# Patient Record
Sex: Female | Born: 1975 | Race: White | Hispanic: No | Marital: Married | Smoking: Never smoker
Health system: Southern US, Community
[De-identification: ages and names within clinical notes are randomized; demographics above are authoritative.]

## PROBLEM LIST (undated history)

## (undated) DIAGNOSIS — N939 Abnormal uterine and vaginal bleeding, unspecified: Secondary | ICD-10-CM

## (undated) DIAGNOSIS — O2230 Deep phlebothrombosis in pregnancy, unspecified trimester: Secondary | ICD-10-CM

## (undated) DIAGNOSIS — D6859 Other primary thrombophilia: Secondary | ICD-10-CM

## (undated) DIAGNOSIS — D500 Iron deficiency anemia secondary to blood loss (chronic): Secondary | ICD-10-CM

## (undated) DIAGNOSIS — D259 Leiomyoma of uterus, unspecified: Secondary | ICD-10-CM

## (undated) HISTORY — DX: Deep phlebothrombosis in pregnancy, unspecified trimester: O22.30

## (undated) HISTORY — DX: Other primary thrombophilia: D68.59

---
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
## 2016-02-19 ENCOUNTER — Encounter (HOSPITAL_BASED_OUTPATIENT_CLINIC_OR_DEPARTMENT_OTHER): Payer: Self-pay | Admitting: *Deleted

## 2016-02-19 ENCOUNTER — Emergency Department (HOSPITAL_BASED_OUTPATIENT_CLINIC_OR_DEPARTMENT_OTHER)
Admission: EM | Admit: 2016-02-19 | Discharge: 2016-02-19 | Disposition: A | Discharge status: Home / Self Care | Payer: BLUE CROSS/BLUE SHIELD | Attending: Emergency Medicine | Admitting: Emergency Medicine

## 2016-02-19 DIAGNOSIS — R1030 Lower abdominal pain, unspecified: Secondary | ICD-10-CM | POA: Diagnosis present

## 2016-02-19 DIAGNOSIS — N946 Dysmenorrhea, unspecified: Secondary | ICD-10-CM | POA: Diagnosis not present

## 2016-02-19 DIAGNOSIS — R109 Unspecified abdominal pain: Secondary | ICD-10-CM

## 2016-02-19 LAB — COMPREHENSIVE METABOLIC PANEL
ALT: 16 U/L (ref 14–54)
AST: 21 U/L (ref 15–41)
Albumin: 4.7 g/dL (ref 3.5–5.0)
Alkaline Phosphatase: 43 U/L (ref 38–126)
Anion gap: 8 (ref 5–15)
BUN: 16 mg/dL (ref 6–20)
CO2: 24 mmol/L (ref 22–32)
Calcium: 9.3 mg/dL (ref 8.9–10.3)
Chloride: 104 mmol/L (ref 101–111)
Creatinine, Ser: 0.66 mg/dL (ref 0.44–1.00)
GFR calc Af Amer: >60 mL/min (ref >60)
GFR calc non Af Amer: >60 mL/min (ref >60)
Glucose, Bld: 182 mg/dL — ABNORMAL HIGH (ref 65–99)
Potassium: 3.2 mmol/L — ABNORMAL LOW (ref 3.5–5.1)
Sodium: 136 mmol/L (ref 135–145)
Total Bilirubin: 1 mg/dL (ref 0.3–1.2)
Total Protein: 7.3 g/dL (ref 6.5–8.1)

## 2016-02-19 LAB — URINALYSIS, ROUTINE W REFLEX MICROSCOPIC
Bilirubin Urine: NEGATIVE
Glucose, UA: NEGATIVE mg/dL
Ketones, ur: 15 mg/dL — AB
Leukocytes, UA: NEGATIVE
Nitrite: NEGATIVE
Protein, ur: NEGATIVE mg/dL
Specific Gravity, Urine: 1.019 (ref 1.005–1.030)
pH: 8.5 — ABNORMAL HIGH (ref 5.0–8.0)

## 2016-02-19 LAB — CBC WITH DIFFERENTIAL/PLATELET
Basophils Absolute: 0 10*3/uL (ref 0.0–0.1)
Basophils Relative: 0 %
Eosinophils Absolute: 0 10*3/uL (ref 0.0–0.7)
Eosinophils Relative: 0 %
HCT: 39.6 % (ref 36.0–46.0)
Hemoglobin: 13.7 g/dL (ref 12.0–15.0)
Lymphocytes Relative: 9 %
Lymphs Abs: 1 10*3/uL (ref 0.7–4.0)
MCH: 27.3 pg (ref 26.0–34.0)
MCHC: 34.6 g/dL (ref 30.0–36.0)
MCV: 78.9 fL (ref 78.0–100.0)
Monocytes Absolute: 0.6 10*3/uL (ref 0.1–1.0)
Monocytes Relative: 5 %
Neutro Abs: 10 10*3/uL — ABNORMAL HIGH (ref 1.7–7.7)
Neutrophils Relative %: 86 %
Platelets: 205 10*3/uL (ref 150–400)
RBC: 5.02 MIL/uL (ref 3.87–5.11)
RDW: 12.6 % (ref 11.5–15.5)
WBC: 11.7 10*3/uL — ABNORMAL HIGH (ref 4.0–10.5)

## 2016-02-19 LAB — URINE MICROSCOPIC-ADD ON: WBC, UA: NONE SEEN WBC/hpf (ref 0–5)

## 2016-02-19 LAB — WET PREP, GENITAL
Sperm: NONE SEEN
Trich, Wet Prep: NONE SEEN
Yeast Wet Prep HPF POC: NONE SEEN

## 2016-02-19 LAB — LIPASE, BLOOD: Lipase: 38 U/L (ref 11–51)

## 2016-02-19 LAB — PREGNANCY, URINE: Preg Test, Ur: NEGATIVE

## 2016-02-19 MED ORDER — IBUPROFEN 800 MG PO TABS: 800.0000 mg | ORAL_TABLET | Freq: Three times a day (TID) | ORAL | 0 refills | Status: DC

## 2016-02-19 NOTE — ED Provider Notes (Signed)
[facility] Provider Note   CSN: [id number] Arrival date & time: 02/19/16  1715  First Provider Contact:  First MD Initiated Contact with Patient 02/19/16 1851        By signing my name below, I, Hansel Feinstein, attest that this documentation has been prepared under the direction and in the presence of  Alaya Iverson, PA-C. Electronically Signed: Hansel Feinstein, ED Scribe. 02/19/16. 6:56 PM.    History   Chief Complaint Chief Complaint  Patient presents with  . Abdominal Pain    HPI Chelsey Hess is a 40 y.o. female who presents to the Emergency Department complaining of moderate, sharp lower abdominal pain onset at 3 pm this afternoon with associated nausea, one episode of emesis. Per pt, her period began today, and her current pain is similar to prior menses, but more severe. She is currently on her menstrual cycle right now.  She states her pelvic pain has currently improved. H/o cesarean section, but no other abdominal surgery. Last BM was normal this afternoon. She denies fever, chills, diarrhea, additional complaints.   The history is provided by the patient. No language interpreter was used.    History reviewed. No pertinent past medical history.  There are no active problems to display for this patient.   Past Surgical History:  Procedure Laterality Date  . CESAREAN SECTION     x 2    OB History    No data available       Home Medications    Prior to Admission medications   Not on File    Family History No family history on file.  Social History Social History  Substance Use Topics  . Smoking status: Not on file  . Smokeless tobacco: Not on file  . Alcohol use Not on file     Allergies   Review of patient's allergies indicates not on file.   Review of Systems Review of Systems  Constitutional: Negative for chills and fever.  Gastrointestinal: Positive for abdominal pain, nausea and vomiting. Negative for diarrhea.  Neurological:  Negative for dizziness.     Physical Exam Updated Vital Signs BP 109/72 (BP Location: Left Arm)   Pulse 77   Temp 98.9 F (37.2 C) (Oral)   Resp 18   Ht 5\' 8"  (1.727 m)   Wt 158 lb (71.7 kg)   LMP 02/19/2016 (Exact Date)   SpO2 100%   BMI 24.02 kg/m   Physical Exam  Constitutional: She appears well-developed and well-nourished.  HENT:  Head: Normocephalic.  Eyes: Conjunctivae are normal.  Cardiovascular: Normal rate, regular rhythm and normal heart sounds.   No murmur heard. Pulmonary/Chest: Effort normal and breath sounds normal. No respiratory distress. She has no wheezes. She has no rales.  Lungs CTA bilaterally.   Abdominal: Soft. She exhibits no distension. There is tenderness. There is no rebound and no guarding.  Mild TTP of the LLQ. No rebound or guarding.   Genitourinary: Uterus is not tender. Cervix exhibits no motion tenderness. Right adnexum displays no tenderness and no fullness. Left adnexum displays no tenderness and no fullness.  Genitourinary Comments: There is some blood in the vaginal vault. Chaperone present throughout entire exam.    Musculoskeletal: Normal range of motion.  Neurological: She is alert.  Skin: Skin is warm and dry.  Psychiatric: She has a normal mood and affect. Her behavior is normal.  Nursing note and vitals reviewed.    ED Treatments / Results  Labs (all labs ordered are  listed, but only abnormal results are displayed) Labs Reviewed  URINALYSIS, ROUTINE W REFLEX MICROSCOPIC (NOT AT [facility]) - Abnormal; Notable for the following:       Result Value   APPearance CLOUDY (*)    pH 8.5 (*)    Hgb urine dipstick MODERATE (*)    Ketones, ur 15 (*)    All other components within normal limits  URINE MICROSCOPIC-ADD ON - Abnormal; Notable for the following:    Squamous Epithelial / LPF 6-30 (*)    Bacteria, UA FEW (*)    All other components within normal limits  PREGNANCY, URINE    EKG  EKG Interpretation None        Radiology No results found.  Procedures Procedures (including critical care time)  DIAGNOSTIC STUDIES: Oxygen Saturation is 100% on RA, normal by my interpretation.    COORDINATION OF CARE: 6:55 PM Discussed treatment plan with pt at bedside which includes UA and pt agreed to plan.  8:31 PM Reassessed abdomen; abdomen soft with no tenderness.     Medications Ordered in ED Medications - No data to display   Initial Impression / Assessment and Plan / ED Course  I have reviewed the triage vital signs and the nursing notes.  Pertinent labs & imaging results that were available during my care of the patient were reviewed by me and considered in my medical decision making (see chart for details).  Clinical Course    Chelsey Hess presents to the ED for evaluation of vaginal bleeding and lower abdominal cramping.  She states that symptoms are similar to symptoms she has had with previous menstrual cycle, but worse.  No CMT or adnexal tenderness with pelvic exam.  Conservative therapies discussed and recommended.  Urine pregnancy negative.  Labs unremarkable.   Patient appears stable for discharge at this time. Return precautions discussed and outlined in discharge paperwork. Patient is agreeable to plan.     Final Clinical Impressions(s) / ED Diagnoses   Final diagnoses:  None    New Prescriptions New Prescriptions   No medications on file   I personally performed the services described in this documentation, which was scribed in my presence. The recorded information has been reviewed and is accurate.    Hyman Bible, PA-C 02/22/16 Pacolet Liu, MD 02/22/16 2040

## 2016-02-19 NOTE — ED Notes (Addendum)
Pt waiting with family at bedside for EDP evaluation.

## 2016-02-19 NOTE — ED Triage Notes (Addendum)
Patient states she developed a pinching lower abdominal pain today around 1530 pm.  The pain was associated with nausea and vomiting x 1.  States her menstrual cycle started at the same time her pain started today.  States her cycle was approximatety 4 days early.

## 2016-02-20 LAB — GC/CHLAMYDIA PROBE AMP (~~LOC~~) NOT AT ARMC
Chlamydia: NEGATIVE
Neisseria Gonorrhea: NEGATIVE

## 2018-08-27 ENCOUNTER — Encounter (HOSPITAL_BASED_OUTPATIENT_CLINIC_OR_DEPARTMENT_OTHER): Payer: Self-pay | Admitting: *Deleted

## 2018-08-27 ENCOUNTER — Other Ambulatory Visit: Payer: Self-pay

## 2018-08-27 ENCOUNTER — Other Ambulatory Visit: Payer: Self-pay | Admitting: Obstetrics & Gynecology

## 2018-08-27 NOTE — Progress Notes (Addendum)
Spoke with elana and friend from work Patient requested [place] interpreter day of surgery, if none available patient husband speaks fluent english and will help translate if needed, requested bulgarian interpreter for day of surgery Npo after midnight, arrive 34 am 08-29-2018 [facility]  no meds to take Spouse alton driver Patient unable to come for pre op labs work due to works until 500 pm and cannot get off work Has surgery orders in epic Needs cbc and urine pregnancy day of surgery

## 2018-08-28 NOTE — Progress Notes (Signed)
EMAIL RECEIVED NO BULGARIAN INTERPRETER, USE LANGUAGE LINE, EMAIL PLACED ON CHART

## 2018-08-28 NOTE — H&P (Signed)
Chelsey Hess is an 43 y.o. female with a history of menorrhagia and was found to have fibroids, here for dilation and currettage hysteroscopy with myosure.  Pertinent Gynecological History: Menses: heavy monthly periods. Bleeding: menorrhagia Contraception: none DES exposure: unknown Blood transfusions: none Sexually transmitted diseases: no past history Previous GYN Procedures: None  Last mammogram: Done 08/25/2018: Results pending.  Last pap: normal Date: 05/15/18 OB History: G3, P2012   Menstrual History: Patient's last menstrual period was 08/22/2018.    History reviewed. No pertinent past medical history.  Past Surgical History:  Procedure Laterality Date  . CESAREAN SECTION     x 2    History reviewed. No pertinent family history.  Social History:  reports that she has never smoked. She has never used smokeless tobacco. She reports that she does not drink alcohol or use drugs.  Allergies: No Known Allergies  No medications prior to admission.    ROS  Constitutional: Denies fevers/chills Cardiovascular: Denies chest pain or palpitations Pulmonary: Denies coughing or wheezing Gastrointestinal: Denies nausea, vomiting or diarrhea Genitourinary: Denies pelvic pain, unusual vaginal discharge, dysuria, urgency or frequency. With menorrhagia.  Musculoskeletal: Denies muscle or joint aches and pain.  Neurology: Denies abnormal sensations such as tingling or numbness.    Height 5\' 8"  (1.727 m), weight 66.7 kg, last menstrual period 08/22/2018.  Blood pressure 120/75, pulse 69, temperature (!) 97.4 F (36.3 C), temperature source Oral, resp. rate 16, height 5\' 8"  (1.727 m), weight 67.9 kg, last menstrual period 08/22/2018, SpO2 100 %. Physical Exam Constitutional: She is oriented to person, place, and time. She appears well-developed and well-nourished.  HENT:  Head: Normocephalic and atraumatic.  Neck: Normal range of motion.  Cardiovascular: Normal rate, regular  rhythm and normal heart sounds.   Respiratory: Effort normal and breath sounds normal.  GI: Soft. Bowel sounds are normal.  Neurological: She is alert and oriented to person, place, and time.  Skin: Skin is warm and dry.  Psychiatric: She has a normal mood and affect. Her behavior is normal.   No results found for this or any previous visit (from the past 24 hour(s)). CBC    Component Value Date/Time   WBC 11.7 (H) 02/19/2016 1900   RBC 5.02 02/19/2016 1900   HGB 13.7 02/19/2016 1900   HCT 39.6 02/19/2016 1900   PLT 205 02/19/2016 1900   MCV 78.9 02/19/2016 1900   MCH 27.3 02/19/2016 1900   MCHC 34.6 02/19/2016 1900   RDW 12.6 02/19/2016 1900   LYMPHSABS 1.0 02/19/2016 1900   MONOABS 0.6 02/19/2016 1900   EOSABS 0.0 02/19/2016 1900   BASOSABS 0.0 02/19/2016 1900    CBC    Component Value Date/Time   WBC 5.3 08/29/2018 0951   RBC 5.22 (H) 08/29/2018 0951   HGB 13.8 08/29/2018 0951   HCT 43.0 08/29/2018 0951   PLT 240 08/29/2018 0951   MCV 82.4 08/29/2018 0951   MCH 26.4 08/29/2018 0951   MCHC 32.1 08/29/2018 0951   RDW 12.5 08/29/2018 0951   LYMPHSABS 1.0 02/19/2016 1900   MONOABS 0.6 02/19/2016 1900   EOSABS 0.0 02/19/2016 1900   BASOSABS 0.0 02/19/2016 1900   Pelvic Ultrasound 08/25/2018: Anteverted uterus measuring 10.4 cm.  2 subserosal fundal fibroids measuring 1.8 to 2.9 cm.1 intramural fibroid measuring 1.7 cm. 1 intramural/submucosal fibroid measuring 2.5 cm.  Normal adnexa bilaterally.    08/29/2018: Urine pregnancy test: Negative  Assessment/Plan: 43 y/o with menorrhagia and found to have fibroids, here for dilation and curretage  hysteroscopy with myosure,  Admit to [facility] at [facility] NPO and IVFluids  Sign consent form.  Discussed with patient all risks, benefits and alternatives of the procedure including risks of bleeding, infection, damage to organs, Asherman's syndrome and need for additional procedures/surgeries.  She expressed full  understanding and was consented for the procedure.   Alinda Dooms, MD.  08/28/2018, 4:11 PM

## 2018-08-29 ENCOUNTER — Ambulatory Visit (HOSPITAL_BASED_OUTPATIENT_CLINIC_OR_DEPARTMENT_OTHER): Payer: 59 | Admitting: Anesthesiology

## 2018-08-29 ENCOUNTER — Encounter (HOSPITAL_BASED_OUTPATIENT_CLINIC_OR_DEPARTMENT_OTHER)
Admission: RE | Disposition: A | Discharge status: Home / Self Care | Payer: Self-pay | Source: Home / Self Care | Attending: Obstetrics & Gynecology

## 2018-08-29 ENCOUNTER — Encounter (HOSPITAL_BASED_OUTPATIENT_CLINIC_OR_DEPARTMENT_OTHER): Payer: Self-pay

## 2018-08-29 ENCOUNTER — Other Ambulatory Visit: Payer: Self-pay

## 2018-08-29 ENCOUNTER — Ambulatory Visit (HOSPITAL_BASED_OUTPATIENT_CLINIC_OR_DEPARTMENT_OTHER)
Admission: RE | Admit: 2018-08-29 | Discharge: 2018-08-29 | Disposition: A | Discharge status: Home / Self Care | Payer: 59 | Attending: Obstetrics & Gynecology | Admitting: Obstetrics & Gynecology

## 2018-08-29 DIAGNOSIS — N840 Polyp of corpus uteri: Secondary | ICD-10-CM | POA: Diagnosis not present

## 2018-08-29 DIAGNOSIS — N920 Excessive and frequent menstruation with regular cycle: Secondary | ICD-10-CM | POA: Insufficient documentation

## 2018-08-29 DIAGNOSIS — D259 Leiomyoma of uterus, unspecified: Secondary | ICD-10-CM | POA: Diagnosis present

## 2018-08-29 DIAGNOSIS — N841 Polyp of cervix uteri: Secondary | ICD-10-CM | POA: Diagnosis not present

## 2018-08-29 HISTORY — PX: DILATATION & CURETTAGE/HYSTEROSCOPY WITH MYOSURE: SHX6511

## 2018-08-29 LAB — BASIC METABOLIC PANEL
Anion gap: 8 (ref 5–15)
BUN: 14 mg/dL (ref 6–20)
CO2: 24 mmol/L (ref 22–32)
Calcium: 8.1 mg/dL — ABNORMAL LOW (ref 8.9–10.3)
Chloride: 109 mmol/L (ref 98–111)
Creatinine, Ser: 0.51 mg/dL (ref 0.44–1.00)
GFR calc Af Amer: >60 mL/min (ref >60)
GFR calc non Af Amer: >60 mL/min (ref >60)
Glucose, Bld: 115 mg/dL — ABNORMAL HIGH (ref 70–99)
Potassium: 4.4 mmol/L (ref 3.5–5.1)
Sodium: 141 mmol/L (ref 135–145)

## 2018-08-29 LAB — CBC
HCT: 43 % (ref 36.0–46.0)
Hemoglobin: 13.8 g/dL (ref 12.0–15.0)
MCH: 26.4 pg (ref 26.0–34.0)
MCHC: 32.1 g/dL (ref 30.0–36.0)
MCV: 82.4 fL (ref 80.0–100.0)
Platelets: 240 10*3/uL (ref 150–400)
RBC: 5.22 MIL/uL — ABNORMAL HIGH (ref 3.87–5.11)
RDW: 12.5 % (ref 11.5–15.5)
WBC: 5.3 10*3/uL (ref 4.0–10.5)
nRBC: 0 % (ref 0.0–0.2)

## 2018-08-29 LAB — POCT PREGNANCY, URINE: Preg Test, Ur: NEGATIVE

## 2018-08-29 SURGERY — DILATATION & CURETTAGE/HYSTEROSCOPY WITH MYOSURE
Anesthesia: General

## 2018-08-29 MED ORDER — LIDOCAINE 2% (20 MG/ML) 5 ML SYRINGE
INTRAMUSCULAR | Status: DC | PRN
  Administered 2018-08-29: 25 mg via INTRAVENOUS

## 2018-08-29 MED ORDER — ONDANSETRON HCL 4 MG/2ML IJ SOLN
4.0000 mg | Freq: Once | INTRAMUSCULAR | Status: DC | PRN
  Filled 2018-08-29: qty 2

## 2018-08-29 MED ORDER — ACETAMINOPHEN 160 MG/5ML PO SOLN
325.0000 mg | ORAL | Status: DC | PRN
  Filled 2018-08-29: qty 20.3

## 2018-08-29 MED ORDER — PROPOFOL 500 MG/50ML IV EMUL
INTRAVENOUS | Status: DC | PRN
  Administered 2018-08-29: 200 ug/kg/min via INTRAVENOUS

## 2018-08-29 MED ORDER — FENTANYL CITRATE (PF) 100 MCG/2ML IJ SOLN
INTRAMUSCULAR | Status: AC
  Filled 2018-08-29: qty 2

## 2018-08-29 MED ORDER — OXYCODONE-ACETAMINOPHEN 5-325 MG PO TABS: 1.0000 | ORAL_TABLET | Freq: Four times a day (QID) | ORAL | 0 refills | Status: AC | PRN

## 2018-08-29 MED ORDER — MIDAZOLAM HCL 2 MG/2ML IJ SOLN
INTRAMUSCULAR | Status: AC
  Filled 2018-08-29: qty 2

## 2018-08-29 MED ORDER — KETOROLAC TROMETHAMINE 30 MG/ML IJ SOLN
INTRAMUSCULAR | Status: DC | PRN
  Administered 2018-08-29: 30 mg via INTRAVENOUS

## 2018-08-29 MED ORDER — DEXAMETHASONE SODIUM PHOSPHATE 10 MG/ML IJ SOLN
INTRAMUSCULAR | Status: AC
  Filled 2018-08-29: qty 1

## 2018-08-29 MED ORDER — OXYCODONE HCL 5 MG/5ML PO SOLN
5.0000 mg | Freq: Once | ORAL | Status: DC | PRN
  Filled 2018-08-29: qty 5

## 2018-08-29 MED ORDER — ONDANSETRON HCL 4 MG/2ML IJ SOLN
INTRAMUSCULAR | Status: AC
  Filled 2018-08-29: qty 2

## 2018-08-29 MED ORDER — BUPIVACAINE-EPINEPHRINE 0.5% -1:200000 IJ SOLN
INTRAMUSCULAR | Status: DC | PRN
  Administered 2018-08-29: 10 mL

## 2018-08-29 MED ORDER — LIDOCAINE 2% (20 MG/ML) 5 ML SYRINGE
INTRAMUSCULAR | Status: AC
  Filled 2018-08-29: qty 5

## 2018-08-29 MED ORDER — IBUPROFEN 600 MG PO TABS: 600.0000 mg | ORAL_TABLET | Freq: Four times a day (QID) | ORAL | 0 refills | Status: DC | PRN

## 2018-08-29 MED ORDER — DEXAMETHASONE SODIUM PHOSPHATE 10 MG/ML IJ SOLN
INTRAMUSCULAR | Status: DC | PRN
  Administered 2018-08-29: 5 mg via INTRAVENOUS

## 2018-08-29 MED ORDER — FENTANYL CITRATE (PF) 100 MCG/2ML IJ SOLN
25.0000 ug | INTRAMUSCULAR | Status: DC | PRN
  Administered 2018-08-29: 50 ug via INTRAVENOUS
  Filled 2018-08-29: qty 1

## 2018-08-29 MED ORDER — KETOROLAC TROMETHAMINE 30 MG/ML IJ SOLN
INTRAMUSCULAR | Status: AC
  Filled 2018-08-29: qty 1

## 2018-08-29 MED ORDER — FENTANYL CITRATE (PF) 100 MCG/2ML IJ SOLN
INTRAMUSCULAR | Status: DC | PRN
  Administered 2018-08-29 (×2): 25 ug via INTRAVENOUS
  Administered 2018-08-29: 50 ug via INTRAVENOUS

## 2018-08-29 MED ORDER — PROPOFOL 10 MG/ML IV BOLUS
INTRAVENOUS | Status: AC
  Filled 2018-08-29: qty 20

## 2018-08-29 MED ORDER — MIDAZOLAM HCL 2 MG/2ML IJ SOLN
INTRAMUSCULAR | Status: DC | PRN
  Administered 2018-08-29: 2 mg via INTRAVENOUS

## 2018-08-29 MED ORDER — SODIUM CHLORIDE 0.9 % IR SOLN
Status: DC | PRN
  Administered 2018-08-29: 3000 mL

## 2018-08-29 MED ORDER — ACETAMINOPHEN 325 MG PO TABS
325.0000 mg | ORAL_TABLET | ORAL | Status: DC | PRN
  Filled 2018-08-29: qty 2

## 2018-08-29 MED ORDER — SILVER NITRATE-POT NITRATE 75-25 % EX MISC
CUTANEOUS | Status: DC | PRN
  Administered 2018-08-29: 8
  Administered 2018-08-29: 2

## 2018-08-29 MED ORDER — LACTATED RINGERS IV SOLN
INTRAVENOUS | Status: DC
  Administered 2018-08-29: 1000 mL via INTRAVENOUS
  Administered 2018-08-29: 13:00:00 via INTRAVENOUS
  Filled 2018-08-29: qty 1000

## 2018-08-29 MED ORDER — OXYCODONE HCL 5 MG PO TABS
5.0000 mg | ORAL_TABLET | Freq: Once | ORAL | Status: DC | PRN
  Filled 2018-08-29: qty 1

## 2018-08-29 MED ORDER — MEPERIDINE HCL 25 MG/ML IJ SOLN
6.2500 mg | INTRAMUSCULAR | Status: DC | PRN
  Filled 2018-08-29: qty 1

## 2018-08-29 SURGICAL SUPPLY — 16 items
CANISTER SUCT 3000ML PPV (MISCELLANEOUS) ×3 IMPLANT
CATH ROBINSON RED A/P 16FR (CATHETERS) ×3 IMPLANT
COVER WAND RF STERILE (DRAPES) ×3 IMPLANT
DEVICE MYOSURE LITE (MISCELLANEOUS) IMPLANT
DEVICE MYOSURE REACH (MISCELLANEOUS) ×3 IMPLANT
DILATOR CANAL MILEX (MISCELLANEOUS) IMPLANT
GAUZE 4X4 16PLY RFD (DISPOSABLE) ×3 IMPLANT
GLOVE BIOGEL PI IND STRL 7.0 (GLOVE) ×2 IMPLANT
GLOVE BIOGEL PI INDICATOR 7.0 (GLOVE) ×4
GLOVE SURG SS PI 6.5 STRL IVOR (GLOVE) ×3 IMPLANT
GOWN STRL REUS W/TWL LRG LVL3 (GOWN DISPOSABLE) ×6 IMPLANT
KIT PROCEDURE FLUENT (KITS) ×6 IMPLANT
PACK VAGINAL MINOR WOMEN LF (CUSTOM PROCEDURE TRAY) ×3 IMPLANT
PAD OB MATERNITY 4.3X12.25 (PERSONAL CARE ITEMS) ×3 IMPLANT
SEAL ROD LENS SCOPE MYOSURE (ABLATOR) ×3 IMPLANT
TOWEL OR 17X26 10 PK STRL BLUE (TOWEL DISPOSABLE) ×6 IMPLANT

## 2018-08-29 NOTE — Interval H&P Note (Signed)
History and Physical Interval Note:  08/29/2018 12:11 PM  [name]  has presented today for surgery, with the diagnosis of Uterine Fibroids  The various methods of treatment have been discussed with the patient and family. After consideration of risks, benefits and other options for treatment, the patient has consented to  Procedure(s): [place] (N/A) as a surgical intervention .  The patient's history has been reviewed, patient examined, no change in status, stable for surgery.  I have reviewed the patient's chart and labs.  Questions were answered to the patient's satisfaction.     Terresa Marlett WAKURU, EK.

## 2018-08-29 NOTE — Progress Notes (Signed)
In and out cath performed after getting order from Dr. Alesia Richards. Emptied 1100 ml of yellow urine from bladder tolerated well

## 2018-08-29 NOTE — Transfer of Care (Signed)
Immediate Anesthesia Transfer of Care Note  Patient: Chelsey Hess  Procedure(s) Performed: DILATATION & CURETTAGE/HYSTEROSCOPY WITH MYOSURE (N/A )  Patient Location: PACU  Anesthesia Type:MAC  Level of Consciousness: awake, alert  and oriented  Airway & Oxygen Therapy: Patient Spontanous Breathing and Patient connected to nasal cannula oxygen  Post-op Assessment: Report given to RN  Post vital signs: Reviewed and stable  Last Vitals: 119/58, 84, 12, 98%, 97.4 Vitals Value Taken Time  BP    Temp    Pulse    Resp    SpO2      Last Pain:  Vitals:   08/29/18 0947  TempSrc:   PainSc: 0-No pain      Patients Stated Pain Goal: 3 (20/94/70 9628)  Complications: No apparent anesthesia complications

## 2018-08-29 NOTE — Discharge Instructions (Signed)
°  [name],  -expect some vaginal bleeding for the next 2 weeks or so.  Please use pads and notify me if with heavy vaginal bleeding requiring to change a pad every hour or two. -expect some abdominal cramping, please take ibuprofen or percocet as needed for pain. To avoid medication toxicity do not take asprin or naproxen at the same time as ibuprofen.   -Nothing in vagina X 2 weeks, no intercourse, no douching, no tampons or baths for two weeks.  -Please call my office with any questions or concerns.  Dr. Alesia Richards.   Post Anesthesia Home Care Instructions  Activity: Get plenty of rest for the remainder of the day. A responsible individual must stay with you for 24 hours following the procedure.  For the next 24 hours, DO NOT: -Drive a car -Paediatric nurse -Drink alcoholic beverages -Take any medication unless instructed by your physician -Make any legal decisions or sign important papers.  Meals: Start with liquid foods such as gelatin or soup. Progress to regular foods as tolerated. Avoid greasy, spicy, heavy foods. If nausea and/or vomiting occur, drink only clear liquids until the nausea and/or vomiting subsides. Call your physician if vomiting continues.  Special Instructions/Symptoms: Your throat may feel dry or sore from the anesthesia or the breathing tube placed in your throat during surgery. If this causes discomfort, gargle with warm salt water. The discomfort should disappear within 24 hours.  If you had a scopolamine patch placed behind your ear for the management of post- operative nausea and/or vomiting:  1. The medication in the patch is effective for 72 hours, after which it should be removed.  Wrap patch in a tissue and discard in the trash. Wash hands thoroughly with soap and water. 2. You may remove the patch earlier than 72 hours if you experience unpleasant side effects which may include dry mouth, dizziness or visual disturbances. 3. Avoid touching the patch.  Wash your hands with soap and water after contact with the patch.

## 2018-08-29 NOTE — Brief Op Note (Addendum)
08/29/2018  2:32 PM  PATIENT:  Chelsey Hess  43 y.o. female  PRE-OPERATIVE DIAGNOSIS:  Uterine Fibroids, Menorrhagia, Cervical polyp  POST-OPERATIVE DIAGNOSIS:  Uterine Fibroids, Menorrhagia, Cervical polyp  PROCEDURE:  Procedure(s): DILATATION & CURETTAGE/HYSTEROSCOPY WITH MYOSURE (N/A), and Cervical polypectomy.   SURGEON:  Surgeon(s) and Role:    * Waymon Amato, MD - Primary   ANESTHESIA:  MAC  EBL:  70 mL   BLOOD ADMINISTERED:none  DRAINS: none   LOCAL MEDICATIONS USED:  0.5% MARCAINE WITH EPINEPHRINE, Amount: 10 ML   SPECIMEN:  Source of Specimen:  1. Cervical lesion suspect polyp and endocervical currettings.  2. Endometrial lesions suspect fibroid. 3. Endometrial curretings.   DISPOSITION OF SPECIMEN:  PATHOLOGY  COUNTS:  YES  TOURNIQUET:  * No tourniquets in log *  DICTATION: .Note written in [place]: Discharge to home after PACU  PATIENT DISPOSITION:  PACU - hemodynamically stable.   Delay start of Pharmacological VTE agent (>24hrs) due to surgical blood loss or risk of bleeding: not applicable

## 2018-08-29 NOTE — Anesthesia Postprocedure Evaluation (Signed)
Anesthesia Post Note  Patient: Chelsey Hess  Procedure(s) Performed: DILATATION & CURETTAGE/HYSTEROSCOPY WITH MYOSURE (N/A )     Patient location during evaluation: PACU Anesthesia Type: General Level of consciousness: awake Pain management: pain level controlled Vital Signs Assessment: post-procedure vital signs reviewed and stable Respiratory status: spontaneous breathing Cardiovascular status: stable Postop Assessment: no apparent nausea or vomiting Anesthetic complications: no    Last Vitals:  Vitals:   08/29/18 1500 08/29/18 1530  BP: 124/74 115/66  Pulse: 84 78  Resp: (!) 22 14  Temp:    SpO2: 100% 99%    Last Pain:  Vitals:   08/29/18 1530  TempSrc:   PainSc: 0-No pain   Pain Goal: Patients Stated Pain Goal: 3 (08/29/18 0947)                 Huston Foley

## 2018-08-29 NOTE — Interval H&P Note (Signed)
History and Physical Interval Note:  08/29/2018 12:12 PM  Chelsey Hess  has presented today for surgery, with the diagnosis of Uterine Fibroids  The various methods of treatment have been discussed with the patient and family. After consideration of risks, benefits and other options for treatment, the patient has consented to  Procedure(s): [name] (N/A) as a surgical intervention .  The patient's history has been reviewed, patient examined, no change in status, stable for surgery.  I have reviewed the patient's chart and labs.  Questions were answered to the patient's satisfaction.     Alinda Dooms, MD.

## 2018-08-29 NOTE — Anesthesia Preprocedure Evaluation (Addendum)
Anesthesia Evaluation  Patient identified by MRN, date of birth, ID band Patient awake    Reviewed: Allergy & Precautions, H&P , NPO status , Patient's Chart, lab work & pertinent test results, reviewed documented beta blocker date and time   Airway Mallampati: I  TM Distance: >3 FB Neck ROM: full    Dental no notable dental hx. (+) Teeth Intact, Dental Advisory Given,    Pulmonary neg pulmonary ROS,    Pulmonary exam normal breath sounds clear to auscultation       Cardiovascular Exercise Tolerance: Good negative cardio ROS   Rhythm:regular Rate:Normal     Neuro/Psych negative neurological ROS  negative psych ROS   GI/Hepatic negative GI ROS, Neg liver ROS,   Endo/Other  negative endocrine ROS  Renal/GU negative Renal ROS  negative genitourinary   Musculoskeletal   Abdominal   Peds  Hematology negative hematology ROS (+)   Anesthesia Other Findings   Reproductive/Obstetrics negative OB ROS                           Anesthesia Physical Anesthesia Plan  ASA: I  Anesthesia Plan: General   Post-op Pain Management:    Induction: Intravenous  PONV Risk Score and Plan: 3 and Treatment may vary due to age or medical condition, Ondansetron, Dexamethasone and Midazolam  Airway Management Planned: LMA  Additional Equipment:   Intra-op Plan:   Post-operative Plan:   Informed Consent: I have reviewed the patients History and Physical, chart, labs and discussed the procedure including the risks, benefits and alternatives for the proposed anesthesia with the patient or authorized representative who has indicated his/her understanding and acceptance.     Dental Advisory Given  Plan Discussed with: CRNA, Anesthesiologist and Surgeon  Anesthesia Plan Comments: ( )        Anesthesia Quick Evaluation

## 2018-08-29 NOTE — OR Nursing (Signed)
FLUID DEFICIT 3,245ML DR. Alesia Richards WAS ALERTED TO HER DEFICIT LEVELS AT 1500, 2000, AND 2500 ML AND SHE ISISTED ON CONTINUING THE PROCEDURE.

## 2018-08-29 NOTE — Procedures (Addendum)
Surgeon: Dr. Waymon Amato  Assistant: None  Anesthesia: MAC  Complications: None  IV HTDSK:8768 cc normal saline  Fluid deficit: 2245 cc normal saline.   However as per OR circulator fluid deficit of 3000 cc but I believe 2245 is more accurate as most of the fluid counted initially was before correct uterine cavity entry when there was a lot of fluid efflux from the cervix and onto the floor, I believe the amount fluid on the floor was underestimated in this calculation.    Urine: 50 cc straight catheterization  EBL: 70 cc  Indications: 43 year-old P2 with a history of menorrhagia, uterine fibroids and cervical polyp here for resection of the lesions and biopsy of the uterus.          Procedure:  Informed consent was obtained from the patient to undergo the procedure including risks of bleeding, infection and  damage to organs.  She was taken to the operating room and anesthesia was administered without difficulty.  An exam was then performed under anesthesia revealing a small anteverted uterus and a closed cervix. There were no palpable adnexal masses.  She was prepped and draped in the usual sterile fashion. She was straight catheterized.  A graves speculum was used to view the cervix. The 2cm protruding fleshy mass was from the cervix was grasped with a ring forceps, twisted around its base and resected.  Paracervical blocl was instilled on the cervix.  Single-tooth tenaculum was placed posteriorly and then anteriorly on the cervix. The cervix was dilated to #19 pratt dilators but hysteroscope could not advance through the internal cervical os.  I believe a small false tract was created within the myometrium posteriorly while trying to dilate the stenotic and possibly scarred cervix (from the cervical polyp) and correct entry into the uterine cavity could not be confirmed.  Further cervical dilation with Hegar dilators was performed up to number 6.5 dilator. 2.5 mm hysteroscopy could not help  delineate the right pathway to the fundus.  All this time most of the fluid that was being instilled was effluxing from the cervix and most of it ending on the floor.  Second attempt with Myosure diagnostic scope was then placed and this time around entry into the uterine cavity was confirmed as the endometrial lesions were noted.  I did notice that in order to be in the right uterine space I had the instruments entering the cervix almost perpendicular to the floor making me suspect her uterus was anteverted and anteflexed.  The uterus was noted to have thick fluffy endometrial tissue as well as several small fibroids on posterior uterine wall and bilateral side walls, at least three different protrusion of lesions.  The thick endometrial tissue as well as the lesions were shaved off using Myosure REACH under direct visualization without any complications. She tolerated the procedure well. The uterine fundus could be visualized but the tubal could not be visualized due to angling of the uterus, though the pathway to the tubes appeared clear.  No other lesions were noted and the uterine cavity was felt to be free of any protruding lesions even with uterine pressure variation. The myosure was removed and sharp curretage performed to obtain endocervical and endometrial specimen.  Silver nitrate stickes were used to on the tenaculum sites for hemostasis.  All instruments were then removed and the patient was awoken from anesthesia and taken to recovery room in stable condition. I will check a BMP lab test before discharge from the PACU.  Specimens: 1.  Cervical polyp and endocervical curettings.  3. Endometrial lesions suspect fibroids.  4.  Endocervical curettings.   Disposition: Stable to PACU.

## 2018-08-29 NOTE — Anesthesia Procedure Notes (Signed)
Procedure Name: MAC Date/Time: 08/29/2018 12:20 PM Performed by: Wanita Chamberlain, CRNA Pre-anesthesia Checklist: Patient identified, Emergency Drugs available, Suction available and Patient being monitored Patient Re-evaluated:Patient Re-evaluated prior to induction Oxygen Delivery Method: Nasal cannula Preoxygenation: Pre-oxygenation with 100% oxygen Induction Type: IV induction Placement Confirmation: breath sounds checked- equal and bilateral,  CO2 detector and positive ETCO2 Dental Injury: Teeth and Oropharynx as per pre-operative assessment

## 2018-09-01 ENCOUNTER — Encounter (HOSPITAL_BASED_OUTPATIENT_CLINIC_OR_DEPARTMENT_OTHER): Payer: Self-pay | Admitting: Obstetrics & Gynecology

## 2019-06-05 ENCOUNTER — Other Ambulatory Visit: Payer: Self-pay

## 2019-06-05 ENCOUNTER — Inpatient Hospital Stay (HOSPITAL_COMMUNITY): Payer: 59

## 2019-06-05 ENCOUNTER — Encounter (HOSPITAL_COMMUNITY): Payer: Self-pay

## 2019-06-05 ENCOUNTER — Inpatient Hospital Stay (HOSPITAL_COMMUNITY)
Admission: AD | Admit: 2019-06-05 | Discharge: 2019-06-05 | Disposition: A | Discharge status: Home / Self Care | Payer: 59 | Attending: Obstetrics & Gynecology | Admitting: Obstetrics & Gynecology

## 2019-06-05 DIAGNOSIS — O209 Hemorrhage in early pregnancy, unspecified: Secondary | ICD-10-CM | POA: Diagnosis present

## 2019-06-05 DIAGNOSIS — O3680X0 Pregnancy with inconclusive fetal viability, not applicable or unspecified: Secondary | ICD-10-CM

## 2019-06-05 DIAGNOSIS — N939 Abnormal uterine and vaginal bleeding, unspecified: Secondary | ICD-10-CM | POA: Insufficient documentation

## 2019-06-05 DIAGNOSIS — Z3A01 Less than 8 weeks gestation of pregnancy: Secondary | ICD-10-CM | POA: Insufficient documentation

## 2019-06-05 DIAGNOSIS — O09521 Supervision of elderly multigravida, first trimester: Secondary | ICD-10-CM | POA: Insufficient documentation

## 2019-06-05 LAB — ABO/RH: ABO/RH(D): B POS

## 2019-06-05 LAB — CBC
HCT: 39.8 % (ref 36.0–46.0)
Hemoglobin: 12.8 g/dL (ref 12.0–15.0)
MCH: 25.5 pg — ABNORMAL LOW (ref 26.0–34.0)
MCHC: 32.2 g/dL (ref 30.0–36.0)
MCV: 79.3 fL — ABNORMAL LOW (ref 80.0–100.0)
Platelets: 228 10*3/uL (ref 150–400)
RBC: 5.02 MIL/uL (ref 3.87–5.11)
RDW: 14 % (ref 11.5–15.5)
WBC: 5.6 10*3/uL (ref 4.0–10.5)
nRBC: 0 % (ref 0.0–0.2)

## 2019-06-05 LAB — URINALYSIS, ROUTINE W REFLEX MICROSCOPIC
Bilirubin Urine: NEGATIVE
Glucose, UA: NEGATIVE mg/dL
Ketones, ur: NEGATIVE mg/dL
Leukocytes,Ua: NEGATIVE
Nitrite: NEGATIVE
Protein, ur: NEGATIVE mg/dL
Specific Gravity, Urine: 1.023 (ref 1.005–1.030)
pH: 5 (ref 5.0–8.0)

## 2019-06-05 LAB — POCT PREGNANCY, URINE: Preg Test, Ur: POSITIVE — AB

## 2019-06-05 LAB — HCG, QUANTITATIVE, PREGNANCY: hCG, Beta Chain, Quant, S: 195 m[IU]/mL — ABNORMAL HIGH (ref <5)

## 2019-06-05 NOTE — MAU Note (Signed)
PT is G2P3. Pt has IUI pregnancy. She is being at  [facility]. She began having vaginal bleeding yesterday. She describes bleeding as spotting.  She went to the clinic and her HCG was 323. She is concerned because they did not tell her anything. LMP is 04/29/2019.

## 2019-06-05 NOTE — MAU Provider Note (Signed)
History     CSN: [id number]  Arrival date and time: 06/05/19 [id number]   First Provider Initiated Contact with Patient 06/05/19 [phone]      Chief Complaint  Patient presents with  . Vaginal Bleeding   HPI Chelsey Hess is a 43 y.o. [phone] at [redacted]w[redacted]d who presents to MAU with chief complaint of vaginal bleeding. Her pregnancy is the result of IUI through [facility]. She presented there yesterday 06/04/19 for evaluation of bleeding, told her Quant hCG was 323 and states they "didn't tell me what to do after that".  She denies abdominal pain, low back pain, dysuria, fever or recent illness. Most recent intercourse two weeks ago  Patient is s/p d&c with [facility] 08/29/2018.  OB History    Gravida  4   Para  2   Term  2   Preterm      AB  1   Living  3     SAB  1   TAB      Ectopic      Multiple  1   Live Births  3           History reviewed. No pertinent past medical history.  Past Surgical History:  Procedure Laterality Date  . CESAREAN SECTION     x 2  . DILATATION & CURETTAGE/HYSTEROSCOPY WITH MYOSURE N/A 08/29/2018   Procedure: DILATATION & CURETTAGE/HYSTEROSCOPY WITH MYOSURE;  Surgeon: Waymon Amato, MD;  Location: [facility];  Service: Gynecology;  Laterality: N/A;    History reviewed. No pertinent family history.  Social History   Tobacco Use  . Smoking status: Never Smoker  . Smokeless tobacco: Never Used  Substance Use Topics  . Alcohol use: Never    Frequency: Never  . Drug use: Never    Allergies: No Known Allergies  Medications Prior to Admission  Medication Sig Dispense Refill Last Dose  . Prenatal Vit-Fe Fumarate-FA (PRENATAL MULTIVITAMIN) TABS tablet Take 1 tablet by mouth daily at 12 noon.   06/05/2019 at Unknown time  . ibuprofen (ADVIL,MOTRIN) 600 MG tablet Take 1 tablet (600 mg total) by mouth every 6 (six) hours as needed for moderate pain or cramping. 30 tablet 0     Review of Systems   Constitutional: Negative for chills, fatigue and fever.  Gastrointestinal: Negative for abdominal pain.  Genitourinary: Positive for vaginal bleeding. Negative for vaginal pain.  Musculoskeletal: Negative for back pain.  Neurological: Negative for dizziness and syncope.  All other systems reviewed and are negative.  Physical Exam   Blood pressure (!) 137/55, pulse (!) 102, temperature 98.2 F (36.8 C), temperature source Oral, resp. rate 20, height 5\' 8"  (1.727 m), weight 72.5 kg, last menstrual period 04/29/2019, SpO2 100 %.  Physical Exam  Nursing note and vitals reviewed. Constitutional: She is oriented to person, place, and time. She appears well-developed and well-nourished.  Cardiovascular: Normal rate.  Respiratory: Effort normal and breath sounds normal.  GI: Soft. Bowel sounds are normal. She exhibits no distension. There is no abdominal tenderness. There is no rebound, no guarding and no CVA tenderness.  Genitourinary:    Vaginal discharge present.     Genitourinary Comments: Small amount dark brown discharge in vault, removed with fox swab x 2   Neurological: She is alert and oriented to person, place, and time.  Skin: Skin is warm and dry.  Psychiatric: She has a normal mood and affect. Her behavior is normal. Judgment and thought content normal.    MAU  Course  Procedures: sterile speculum exam, ultrasound  --Quant hCG decreased from 323 to 195 today. Discussed with patient that vaginal bleeding and change in quant are not-reassuring signs but we typically advise repeat quant in one week. Patient states she will coordinate through either [facility] or her fertility clinic.  Patient Vitals for the past 24 hrs:  BP Temp Temp src Pulse Resp SpO2 Height Weight  06/05/19 0817 (!) 137/55 98.2 F (36.8 C) Oral (!) 102 20 100 % 5\' 8"  (1.727 m) 72.5 kg   Results for orders placed or performed during the hospital encounter of 06/05/19 (from the past 24 hour(s))  Urinalysis, Routine  w reflex microscopic     Status: Abnormal   Collection Time: 06/05/19  8:21 AM  Result Value Ref Range   Color, Urine YELLOW YELLOW   APPearance HAZY (A) CLEAR   Specific Gravity, Urine 1.023 1.005 - 1.030   pH 5.0 5.0 - 8.0   Glucose, UA NEGATIVE NEGATIVE mg/dL   Hgb urine dipstick LARGE (A) NEGATIVE   Bilirubin Urine NEGATIVE NEGATIVE   Ketones, ur NEGATIVE NEGATIVE mg/dL   Protein, ur NEGATIVE NEGATIVE mg/dL   Nitrite NEGATIVE NEGATIVE   Leukocytes,Ua NEGATIVE NEGATIVE   RBC / HPF 0-5 0 - 5 RBC/hpf   WBC, UA 0-5 0 - 5 WBC/hpf   Bacteria, UA RARE (A) NONE SEEN   Squamous Epithelial / LPF 11-20 0 - 5   Mucus PRESENT   Pregnancy, urine POC     Status: Abnormal   Collection Time: 06/05/19  8:24 AM  Result Value Ref Range   Preg Test, Ur POSITIVE (A) NEGATIVE  CBC     Status: Abnormal   Collection Time: 06/05/19  8:41 AM  Result Value Ref Range   WBC 5.6 4.0 - 10.5 K/uL   RBC 5.02 3.87 - 5.11 MIL/uL   Hemoglobin 12.8 12.0 - 15.0 g/dL   HCT 39.8 36.0 - 46.0 %   MCV 79.3 (L) 80.0 - 100.0 fL   MCH 25.5 (L) 26.0 - 34.0 pg   MCHC 32.2 30.0 - 36.0 g/dL   RDW 14.0 11.5 - 15.5 %   Platelets 228 150 - 400 K/uL   nRBC 0.0 0.0 - 0.2 %  hCG, quantitative, pregnancy     Status: Abnormal   Collection Time: 06/05/19  8:41 AM  Result Value Ref Range   hCG, Beta Chain, Quant, S 195 (H) <5 mIU/mL  ABO/Rh     Status: None   Collection Time: 06/05/19  8:41 AM  Result Value Ref Range   ABO/RH(D) B POS    No rh immune globuloin      NOT A RH IMMUNE GLOBULIN CANDIDATE, PT RH POSITIVE Performed at [facility], [address], [place], [place] [address]    US Ob Comp Less 14 Wks  Result Date: 06/05/2019 CLINICAL DATA:  Vaginal bleeding in first trimester of pregnancy; LMP 04/29/2019; quantitative beta HCG = 195 EXAM: OBSTETRIC <14 WK ULTRASOUND TECHNIQUE: Transabdominal ultrasound was performed for evaluation of the gestation as well as the maternal uterus and adnexal regions. Patient  declined transvaginal imaging. COMPARISON:  None FINDINGS: Intrauterine gestational sac: Absent Yolk sac:  N/A Embryo:  N/A Cardiac Activity: N/A Heart Rate: N/A bpm MSD:    mm    w     d CRL:     mm    w  d  [place] EDC: Subchorionic hemorrhage:  N/A Maternal uterus/adnexae: Mildly enlarged uterus with diffuse myometrial heterogeneity. Small submucosal leiomyoma at posterior mid uterus 2.0 x 1.9 x 2.0 cm. Small anterior wall intramural leiomyoma to LEFT, 1.7 x 1.4 x 1.5 cm. No gestational sac seen. Hypoechoic material within the endometrial canal, could represent hemorrhage/blood. No adnexal masses or free pelvic fluid. RIGHT ovary measures 2.9 x 2.0 x 2.4 cm and contains a small corpus luteum. LEFT ovary normal size and morphology 2.6 x 1.4 x 1.8 cm. IMPRESSION: No intrauterine gestation identified. Findings are compatible with pregnancy of unknown location. Differential diagnosis includes early intrauterine pregnancy too early to visualize, spontaneous abortion, and ectopic pregnancy. Serial quantitative beta HCG and or followup ultrasound recommended to definitively exclude ectopic pregnancy. Small uterine leiomyomata. Electronically Signed   By: Lavonia Dana M.D.   On: 06/05/2019 10:04   Assessment and Plan  --43 y.o. G4P2013 at [redacted]w[redacted]d  --Vaginal bleeding, decreasing Quant hCG --No GS visualized on exam --Discharge home in stable condition with precautions  F/U: --Patient to coordinate repeat Quant hCG in one week   Darlina Rumpf, CNM 06/05/2019, 12:35 PM

## 2019-06-05 NOTE — Discharge Instructions (Signed)
Human Chorionic Gonadotropin Test Why am I having this test? A human chorionic gonadotropin (hCG) test is done to determine whether you are pregnant. It can also be used:  To diagnose an abnormal pregnancy.  To determine whether you have had a failed pregnancy (miscarriage) or are at risk of one. What is being tested? This test checks the level of the human chorionic gonadotropin (hCG) hormone in the blood. This hormone is produced during pregnancy by the cells that form the placenta. The placenta is the organ that grows inside your womb (uterus) to nourish a developing baby. When you are pregnant, hCG can be detected in your blood or urine 7 to 8 days before your missed period. It continues to go up for the first 8-10 weeks of pregnancy. The presence of hCG in your blood can be measured with several different types of tests. You may have:  A urine test. ? Because this hormone is eliminated from your body by your kidneys, you may have a urine test to find out whether you are pregnant. A home pregnancy test detects whether there is hCG in your urine. ? A urine test only shows whether there is hCG in your urine. It does not measure how much.  A qualitative blood test. ? You may have this type of blood test to find out if you are pregnant. ? This blood test only shows whether there is hCG in your blood. It does not measure how much.  A quantitative blood test. ? This type of blood test measures the amount of hCG in your blood. ? You may have this test to:  Diagnose an abnormal pregnancy.  Check whether you have had a miscarriage.  Determine whether you are at risk of a miscarriage. What kind of sample is taken?     Two kinds of samples may be collected to test for the hCG hormone.  Blood. It is usually collected by inserting a needle into a blood vessel.  Urine. It is usually collected by urinating into a germ-free (sterile) specimen cup. It is best to collect the sample the first  time you urinate in the morning. How do I prepare for this test? No preparation is needed for a blood test.  For the urine test:  Let your health care provider know about: ? All medicines you are taking, including vitamins, herbs, creams, and over-the-counter medicines. ? Any blood in your urine. This may interfere with the result.  Do not drink too much fluid. Drink as you normally would, or as directed by your health care provider. How are the results reported? Depending on the type of test that you have, your test results may be reported as values. Your health care provider will compare your results to normal ranges that were established after testing a large group of people (reference ranges). Reference ranges may vary among labs and hospitals. For this test, common reference ranges that show absence of pregnancy are:  Quantitative hCG blood levels: less than 5 IU/L. Other results will be reported as either positive or negative. For this test, normal results (meaning the absence of pregnancy) are:  Negative for hCG in the urine test.  Negative for hCG in the qualitative blood test. What do the results mean? Urine and qualitative blood test  A negative result could mean: ? That you are not pregnant. ? That the test was done too early in your pregnancy to detect hCG in your blood or urine. If you still have other signs   of pregnancy, the test will be repeated. °· A positive result means: °? That you are most likely pregnant. Your health care provider may confirm your pregnancy with an imaging study (ultrasound) of your uterus, if needed. °Quantitative blood test °Results of the quantitative hCG blood test will be interpreted as follows: °· Less than 5 IU/L: You are most likely not pregnant. °· Greater than 25 IU/L: You are most likely pregnant. °· hCG levels that are higher than expected: °? You are pregnant with twins. °? You have abnormal growths in the uterus. °· hCG levels that are  rising more slowly than expected: °? You have an ectopic pregnancy (also called a tubal pregnancy). °· hCG levels that are falling: °? You may be having a miscarriage. °Talk with your health care provider about what your results mean. °Questions to ask your health care provider °Ask your health care provider, or the department that is doing the test: °· When will my results be ready? °· How will I get my results? °· What are my treatment options? °· What other tests do I need? °· What are my next steps? °Summary °· A human chorionic gonadotropin test is done to determine whether you are pregnant. °· When you are pregnant, hCG can be detected in your blood or urine 7 to 8 days before your missed period. It continues to go up for the first 8-10 weeks of pregnancy. °· Your hCG level can be measured with different types of tests. You may have a urine test, a qualitative blood test, or a quantitative blood test. °· Talk with your health care provider about what your results mean. °This information is not intended to replace advice given to you by your health care provider. Make sure you discuss any questions you have with your health care provider. °Document Released: 08/03/2004 Document Revised: 06/03/2017 Document Reviewed: 06/03/2017 °Elsevier Patient Education © 2020 [employer] ° °

## 2019-07-17 NOTE — L&D Delivery Note (Signed)
DELIVERY NOTE  Epidural controlling pain. Pt pushed and delivered a non-viable female infant in LOA position. Anterior and posterior shoulders spontaneously delivered with next two pushes; body easily followed next. Cord was then clamped and cut by MD.  Placenta then delivered at 1348 intact. Fundal massage performed and pitocin per protocol. Fundus firm. The following lacerations were noted: NONE. Slow trickle noted, small amount of membrane noted on manual exploration. 2g Ancef x1 ordered. Mother stable, holding baby girl. Desires to hold for some time, eventually planning on cremation. Counts correct   Infant time: 1343 Gender: female Placenta time: 1348 Apgars: 0/0 Weight pending

## 2019-10-01 ENCOUNTER — Inpatient Hospital Stay (HOSPITAL_COMMUNITY): Payer: 59

## 2019-10-01 ENCOUNTER — Other Ambulatory Visit: Payer: Self-pay

## 2019-10-01 ENCOUNTER — Inpatient Hospital Stay (HOSPITAL_COMMUNITY)
Admission: AD | Admit: 2019-10-01 | Discharge: 2019-10-01 | Disposition: A | Discharge status: Home / Self Care | Payer: 59 | Attending: Obstetrics & Gynecology | Admitting: Obstetrics & Gynecology

## 2019-10-01 ENCOUNTER — Encounter (HOSPITAL_COMMUNITY): Payer: Self-pay | Admitting: Obstetrics and Gynecology

## 2019-10-01 DIAGNOSIS — Z3A01 Less than 8 weeks gestation of pregnancy: Secondary | ICD-10-CM | POA: Diagnosis not present

## 2019-10-01 DIAGNOSIS — O09522 Supervision of elderly multigravida, second trimester: Secondary | ICD-10-CM | POA: Diagnosis not present

## 2019-10-01 DIAGNOSIS — O209 Hemorrhage in early pregnancy, unspecified: Secondary | ICD-10-CM | POA: Diagnosis present

## 2019-10-01 LAB — URINALYSIS, ROUTINE W REFLEX MICROSCOPIC
Bilirubin Urine: NEGATIVE
Glucose, UA: NEGATIVE mg/dL
Ketones, ur: NEGATIVE mg/dL
Leukocytes,Ua: NEGATIVE
Nitrite: NEGATIVE
Protein, ur: NEGATIVE mg/dL
Specific Gravity, Urine: 1.011 (ref 1.005–1.030)
pH: 5 (ref 5.0–8.0)

## 2019-10-01 LAB — HCG, QUANTITATIVE, PREGNANCY: hCG, Beta Chain, Quant, S: 12503 m[IU]/mL — ABNORMAL HIGH (ref <5)

## 2019-10-01 LAB — CBC
HCT: 38.5 % (ref 36.0–46.0)
Hemoglobin: 12.3 g/dL (ref 12.0–15.0)
MCH: 25.2 pg — ABNORMAL LOW (ref 26.0–34.0)
MCHC: 31.9 g/dL (ref 30.0–36.0)
MCV: 78.7 fL — ABNORMAL LOW (ref 80.0–100.0)
Platelets: 241 10*3/uL (ref 150–400)
RBC: 4.89 MIL/uL (ref 3.87–5.11)
RDW: 13.7 % (ref 11.5–15.5)
WBC: 7.4 10*3/uL (ref 4.0–10.5)
nRBC: 0 % (ref 0.0–0.2)

## 2019-10-01 LAB — POCT PREGNANCY, URINE: Preg Test, Ur: POSITIVE — AB

## 2019-10-01 NOTE — Discharge Instructions (Signed)
First Trimester of Pregnancy The first trimester of pregnancy is from week 1 until the end of week 13 (months 1 through 3). A week after a sperm fertilizes an egg, the egg will implant on the wall of the uterus. This embryo will begin to develop into a baby. Genes from you and your partner will form the baby. The female genes will determine whether the baby will be a boy or a girl. At 6-8 weeks, the eyes and face will be formed, and the heartbeat can be seen on ultrasound. At the end of 12 weeks, all the baby's organs will be formed. Now that you are pregnant, you will want to do everything you can to have a healthy baby. Two of the most important things are to get good prenatal care and to follow your health care provider's instructions. Prenatal care is all the medical care you receive before the baby's birth. This care will help prevent, find, and treat any problems during the pregnancy and childbirth. Body changes during your first trimester Your body goes through many changes during pregnancy. The changes vary from woman to woman.  You may gain or lose a couple of pounds at first.  You may feel sick to your stomach (nauseous) and you may throw up (vomit). If the vomiting is uncontrollable, call your health care provider.  You may tire easily.  You may develop headaches that can be relieved by medicines. All medicines should be approved by your health care provider.  You may urinate more often. Painful urination may mean you have a bladder infection.  You may develop heartburn as a result of your pregnancy.  You may develop constipation because certain hormones are causing the muscles that push stool through your intestines to slow down.  You may develop hemorrhoids or swollen veins (varicose veins).  Your breasts may begin to grow larger and become tender. Your nipples may stick out more, and the tissue that surrounds them (areola) may become darker.  Your gums may bleed and may be  sensitive to brushing and flossing.  Dark spots or blotches (chloasma, mask of pregnancy) may develop on your face. This will likely fade after the baby is born.  Your menstrual periods will stop.  You may have a loss of appetite.  You may develop cravings for certain kinds of food.  You may have changes in your emotions from day to day, such as being excited to be pregnant or being concerned that something may go wrong with the pregnancy and baby.  You may have more vivid and strange dreams.  You may have changes in your hair. These can include thickening of your hair, rapid growth, and changes in texture. Some women also have hair loss during or after pregnancy, or hair that feels dry or thin. Your hair will most likely return to normal after your baby is born. What to expect at prenatal visits During a routine prenatal visit:  You will be weighed to make sure you and the baby are growing normally.  Your blood pressure will be taken.  Your abdomen will be measured to track your baby's growth.  The fetal heartbeat will be listened to between weeks 10 and 14 of your pregnancy.  Test results from any previous visits will be discussed. Your health care provider may ask you:  How you are feeling.  If you are feeling the baby move.  If you have had any abnormal symptoms, such as leaking fluid, bleeding, severe headaches, or abdominal   cramping.  If you are using any tobacco products, including cigarettes, chewing tobacco, and electronic cigarettes.  If you have any questions. Other tests that may be performed during your first trimester include:  Blood tests to find your blood type and to check for the presence of any previous infections. The tests will also be used to check for low iron levels (anemia) and protein on red blood cells (Rh antibodies). Depending on your risk factors, or if you previously had diabetes during pregnancy, you may have tests to check for high blood sugar  that affects pregnant women (gestational diabetes).  Urine tests to check for infections, diabetes, or protein in the urine.  An ultrasound to confirm the proper growth and development of the baby.  Fetal screens for spinal cord problems (spina bifida) and Down syndrome.  HIV (human immunodeficiency virus) testing. Routine prenatal testing includes screening for HIV, unless you choose not to have this test.  You may need other tests to make sure you and the baby are doing well. Follow these instructions at home: Medicines  Follow your health care provider's instructions regarding medicine use. Specific medicines may be either safe or unsafe to take during pregnancy.  Take a prenatal vitamin that contains at least 600 micrograms (mcg) of folic acid.  If you develop constipation, try taking a stool softener if your health care provider approves. Eating and drinking   Eat a balanced diet that includes fresh fruits and vegetables, whole grains, good sources of protein such as meat, eggs, or tofu, and low-fat dairy. Your health care provider will help you determine the amount of weight gain that is right for you.  Avoid raw meat and uncooked cheese. These carry germs that can cause birth defects in the baby.  Eating four or five small meals rather than three large meals a day may help relieve nausea and vomiting. If you start to feel nauseous, eating a few soda crackers can be helpful. Drinking liquids between meals, instead of during meals, also seems to help ease nausea and vomiting.  Limit foods that are high in fat and processed sugars, such as fried and sweet foods.  To prevent constipation: ? Eat foods that are high in fiber, such as fresh fruits and vegetables, whole grains, and beans. ? Drink enough fluid to keep your urine clear or pale yellow. Activity  Exercise only as directed by your health care provider. Most women can continue their usual exercise routine during  pregnancy. Try to exercise for 30 minutes at least 5 days a week. Exercising will help you: ? Control your weight. ? Stay in shape. ? Be prepared for labor and delivery.  Experiencing pain or cramping in the lower abdomen or lower back is a good sign that you should stop exercising. Check with your health care provider before continuing with normal exercises.  Try to avoid standing for long periods of time. Move your legs often if you must stand in one place for a long time.  Avoid heavy lifting.  Wear low-heeled shoes and practice good posture.  You may continue to have sex unless your health care provider tells you not to. Relieving pain and discomfort  Wear a good support bra to relieve breast tenderness.  Take warm sitz baths to soothe any pain or discomfort caused by hemorrhoids. Use hemorrhoid cream if your health care provider approves.  Rest with your legs elevated if you have leg cramps or low back pain.  If you develop varicose veins in   your legs, wear support hose. Elevate your feet for 15 minutes, 3-4 times a day. Limit salt in your diet. Prenatal care  Schedule your prenatal visits by the twelfth week of pregnancy. They are usually scheduled monthly at first, then more often in the last 2 months before delivery.  Write down your questions. Take them to your prenatal visits.  Keep all your prenatal visits as told by your health care provider. This is important. Safety  Wear your seat belt at all times when driving.  Make a list of emergency phone numbers, including numbers for family, friends, the hospital, and police and fire departments. General instructions  Ask your health care provider for a referral to a local prenatal education class. Begin classes no later than the beginning of month 6 of your pregnancy.  Ask for help if you have counseling or nutritional needs during pregnancy. Your health care provider can offer advice or refer you to specialists for help  with various needs.  Do not use hot tubs, steam rooms, or saunas.  Do not douche or use tampons or scented sanitary pads.  Do not cross your legs for long periods of time.  Avoid cat litter boxes and soil used by cats. These carry germs that can cause birth defects in the baby and possibly loss of the fetus by miscarriage or stillbirth.  Avoid all smoking, herbs, alcohol, and medicines not prescribed by your health care provider. Chemicals in these products affect the formation and growth of the baby.  Do not use any products that contain nicotine or tobacco, such as cigarettes and e-cigarettes. If you need help quitting, ask your health care provider. You may receive counseling support and other resources to help you quit.  Schedule a dentist appointment. At home, brush your teeth with a soft toothbrush and be gentle when you floss. Contact a health care provider if:  You have dizziness.  You have mild pelvic cramps, pelvic pressure, or nagging pain in the abdominal area.  You have persistent nausea, vomiting, or diarrhea.  You have a bad smelling vaginal discharge.  You have pain when you urinate.  You notice increased swelling in your face, hands, legs, or ankles.  You are exposed to fifth disease or chickenpox.  You are exposed to [place] measles (rubella) and have never had it. Get help right away if:  You have a fever.  You are leaking fluid from your vagina.  You have spotting or bleeding from your vagina.  You have severe abdominal cramping or pain.  You have rapid weight gain or loss.  You vomit blood or material that looks like coffee grounds.  You develop a severe headache.  You have shortness of breath.  You have any kind of trauma, such as from a fall or a car accident. Summary  The first trimester of pregnancy is from week 1 until the end of week 13 (months 1 through 3).  Your body goes through many changes during pregnancy. The changes vary from  woman to woman.  You will have routine prenatal visits. During those visits, your health care provider will examine you, discuss any test results you may have, and talk with you about how you are feeling. This information is not intended to replace advice given to you by your health care provider. Make sure you discuss any questions you have with your health care provider. Document Revised: 06/14/2017 Document Reviewed: 06/13/2016 Elsevier Patient Education  [place]. Vaginal Bleeding During Pregnancy, First Trimester  A small amount of bleeding (spotting) from the vagina is common during early pregnancy. Sometimes the bleeding is normal and does not cause problems. At other times, though, bleeding may be a sign of something serious. Tell your doctor about any bleeding from your vagina right away. Follow these instructions at home: Activity  Follow your doctor's instructions about how active you can be.  If needed, make plans for someone to help with your normal activities.  Do not have sex or orgasms until your doctor says that this is safe. General instructions  Take over-the-counter and prescription medicines only as told by your doctor.  Watch your condition for any changes.  Write down: ? The number of pads you use each day. ? How often you change pads. ? How soaked (saturated) your pads are.  Do not use tampons.  Do not douche.  If you pass any tissue from your vagina, save it to show to your doctor.  Keep all follow-up visits as told by your doctor. This is important. Contact a doctor if:  You have vaginal bleeding at any time while you are pregnant.  You have cramps.  You have a fever. Get help right away if:  You have very bad cramps in your back or belly (abdomen).  You pass large clots or a lot of tissue from your vagina.  Your bleeding gets worse.  You feel light-headed.  You feel weak.  You pass out (faint).  You have chills.  You are  leaking fluid from your vagina.  You have a gush of fluid from your vagina. Summary  Sometimes vaginal bleeding during pregnancy is normal and does not cause problems. At other times, bleeding may be a sign of something serious.  Tell your doctor about any bleeding from your vagina right away.  Follow your doctor's instructions about how active you can be. You may need someone to help you with your normal activities. This information is not intended to replace advice given to you by your health care provider. Make sure you discuss any questions you have with your health care provider. Document Revised: 10/21/2018 Document Reviewed: 10/03/2016 Elsevier Patient Education  [place].

## 2019-10-01 NOTE — MAU Note (Addendum)
LMP 2/12.  Patient had a positive hpt 3/11.  States she had a HCG done on Monday that was around 2,635.  Started bleeding today while she was out shopping-small amount.  Next appointment scheduled for next Friday.  Denies any abdominal pain.  Last intercourse on 3/8.

## 2019-10-01 NOTE — MAU Provider Note (Addendum)
History     CSN: [id number]  Arrival date and time: 10/01/19 1916   First Provider Initiated Contact with Patient 10/01/19 2004      Chief Complaint  Patient presents with  . Vaginal Bleeding   HPI Chelsey Hess is a 44 y.o. [id number] at [redacted]w[redacted]d who presents to MAU with chief complaint of vaginal bleeding in early pregnancy. This is a new problem, onset today while she was shopping. Patient states she was up to void when she noticed blood on her toilet paper. She denies pain, abdominal tenderness, dysuria, fever or recent illness. Sexual intercourse ten days ago.  Patient endorses Quant hCG of 2,635 on Monday 09/28/2019. Her next appointment is [facility] is tomorrow.  OB History    Gravida  5   Para  2   Term  2   Preterm      AB  2   Living  3     SAB  2   TAB      Ectopic      Multiple  1   Live Births  3           History reviewed. No pertinent past medical history.  Past Surgical History:  Procedure Laterality Date  . CESAREAN SECTION     x 2  . DILATATION & CURETTAGE/HYSTEROSCOPY WITH MYOSURE N/A 08/29/2018   Procedure: DILATATION & CURETTAGE/HYSTEROSCOPY WITH MYOSURE;  Surgeon: Waymon Amato, MD;  Location: [facility];  Service: Gynecology;  Laterality: N/A;    History reviewed. No pertinent family history.  Social History   Tobacco Use  . Smoking status: Never Smoker  . Smokeless tobacco: Never Used  Substance Use Topics  . Alcohol use: Never  . Drug use: Never    Allergies: No Known Allergies  Medications Prior to Admission  Medication Sig Dispense Refill Last Dose  . Prenatal Vit-Fe Fumarate-FA (PRENATAL MULTIVITAMIN) TABS tablet Take 1 tablet by mouth daily at 12 noon.   10/01/2019 at Unknown time  . ibuprofen (ADVIL,MOTRIN) 600 MG tablet Take 1 tablet (600 mg total) by mouth every 6 (six) hours as needed for moderate pain or cramping. 30 tablet 0     Review of Systems  Gastrointestinal: Negative for abdominal pain, nausea  and vomiting.  Genitourinary: Positive for vaginal bleeding.  Musculoskeletal: Negative for back pain.  All other systems reviewed and are negative.  Physical Exam   Blood pressure 128/73, pulse 81, temperature 98.6 F (37 C), resp. rate 17, weight 74.5 kg, last menstrual period 08/28/2019, unknown if currently breastfeeding.  Physical Exam  Nursing note and vitals reviewed. Constitutional: She is oriented to person, place, and time. She appears well-developed and well-nourished.  Cardiovascular: Normal rate and normal heart sounds.  Respiratory: Effort normal and breath sounds normal.  GI: Soft. Bowel sounds are normal. She exhibits no distension. There is no abdominal tenderness. There is no rebound and no guarding.  Genitourinary:    Vaginal discharge present.     Genitourinary Comments: Pelvic exam: External genitalia normal, vaginal walls pink and well rugated, cervix visually closed, no lesions noted. Scant dark red blood visualized proximal to cervix. Removed with fox swab x 1.     Musculoskeletal:        General: Normal range of motion.  Neurological: She is alert and oriented to person, place, and time.  Skin: Skin is warm and dry.  Psychiatric: She has a normal mood and affect. Her behavior is normal. Judgment and thought content normal.  MAU Course  Procedures: speculum exam, ultrasound  Orders Placed This Encounter  Procedures  . US OB LESS THAN 14 WEEKS WITH OB TRANSVAGINAL  . Urinalysis, Routine w reflex microscopic  . CBC  . hCG, quantitative, pregnancy  . Pregnancy, urine POC   Report given to M. Jimmye Norman who assumes care of patient at this time.  Mallie Snooks, MSN, CNM Certified Nurse Midwife, [facility] 10/01/19 8:51 PM   Results for orders placed or performed during the hospital encounter of 10/01/19 (from the past 24 hour(s))  Urinalysis, Routine w reflex microscopic     Status: Abnormal   Collection Time: 10/01/19  7:39 PM  Result Value  Ref Range   Color, Urine YELLOW YELLOW   APPearance CLEAR CLEAR   Specific Gravity, Urine 1.011 1.005 - 1.030   pH 5.0 5.0 - 8.0   Glucose, UA NEGATIVE NEGATIVE mg/dL   Hgb urine dipstick LARGE (A) NEGATIVE   Bilirubin Urine NEGATIVE NEGATIVE   Ketones, ur NEGATIVE NEGATIVE mg/dL   Protein, ur NEGATIVE NEGATIVE mg/dL   Nitrite NEGATIVE NEGATIVE   Leukocytes,Ua NEGATIVE NEGATIVE   RBC / HPF 0-5 0 - 5 RBC/hpf   WBC, UA 0-5 0 - 5 WBC/hpf   Bacteria, UA RARE (A) NONE SEEN   Squamous Epithelial / LPF 0-5 0 - 5   Mucus PRESENT   Pregnancy, urine POC     Status: Abnormal   Collection Time: 10/01/19  7:43 PM  Result Value Ref Range   Preg Test, Ur POSITIVE (A) NEGATIVE  CBC     Status: Abnormal   Collection Time: 10/01/19  8:04 PM  Result Value Ref Range   WBC 7.4 4.0 - 10.5 K/uL   RBC 4.89 3.87 - 5.11 MIL/uL   Hemoglobin 12.3 12.0 - 15.0 g/dL   HCT 38.5 36.0 - 46.0 %   MCV 78.7 (L) 80.0 - 100.0 fL   MCH 25.2 (L) 26.0 - 34.0 pg   MCHC 31.9 30.0 - 36.0 g/dL   RDW 13.7 11.5 - 15.5 %   Platelets 241 150 - 400 K/uL   nRBC 0.0 0.0 - 0.2 %  hCG, quantitative, pregnancy     Status: Abnormal   Collection Time: 10/01/19  8:04 PM  Result Value Ref Range   hCG, Beta Chain, Quant, S 12,503 (H) <5 mIU/mL   US OB LESS THAN 14 WEEKS WITH OB TRANSVAGINAL  Result Date: 10/01/2019 CLINICAL DATA:  Vaginal bleeding in early pregnancy. First trimester pregnancy of [redacted] weeks gestation. EXAM: OBSTETRIC <14 WK [place] AND TRANSVAGINAL OB US TECHNIQUE: Both transabdominal and transvaginal ultrasound examinations were performed for complete evaluation of the gestation as well as the maternal uterus, adnexal regions, and pelvic cul-de-sac. Transvaginal technique was performed to assess early pregnancy. COMPARISON:  None this pregnancy. FINDINGS: Intrauterine gestational sac: Single Yolk sac:  Visualized. Embryo:  Not Visualized. Cardiac Activity: Not Visualized. MSD: 9.65 mm   5 w   5 d Subchorionic hemorrhage:  None  visualized. Maternal uterus/adnexae: Heterogeneous myometrial echotexture with several intramural fibroids, largest measuring 2.3 cm. There is a 1.8 cm corpus luteal cyst in the right ovary. Normal ovarian blood flow. The left ovary is not well visualized. No suspicious adnexal mass. IMPRESSION: Early intrauterine gestational sac with a yolk sac, but no fetal pole or cardiac activity yet visualized. Recommend follow-up quantitative B-HCG levels and follow-up US in 10 days to assess viability. This recommendation follows SRU consensus guidelines: Diagnostic Criteria for Nonviable Pregnancy Early in the First Trimester.  [employer] Med [id number]. No subchorionic hemorrhage. Electronically Signed   By: Keith Rake M.D.   On: 10/01/2019 20:54   Reviewed results HCG has more than doubled [place] effectively rules out ectopic with presence of yolk sac in Gestational sac Discussed this is encouraging but will have to repeat US later to determine viability Pt requesting "a shot to stop the bleeding" Discussed there is nothing we routinely give to stop bleeding.  She states in [place] country they gave her a shot to stop bleeding (?progesterone) Discussed this is not something we do in an emergency setting Recommend she call her OB or Dr Kerin Perna in am to discuss    Assessment and Plan  [place] intrauterine pregnancy at [redacted]w[redacted]d by LMP Bleeding in first trimester  Discharge home Bleeding precautions Followup with office Encouraged to return here  if she develops worsening of symptoms, increase in pain, fever, or other concerning symptoms.    Seabron Spates, CNM

## 2019-10-22 ENCOUNTER — Encounter (HOSPITAL_BASED_OUTPATIENT_CLINIC_OR_DEPARTMENT_OTHER): Payer: Self-pay

## 2019-10-22 ENCOUNTER — Emergency Department (HOSPITAL_BASED_OUTPATIENT_CLINIC_OR_DEPARTMENT_OTHER)
Admission: EM | Admit: 2019-10-22 | Discharge: 2019-10-23 | Disposition: A | Discharge status: Home / Self Care | Payer: 59 | Attending: Emergency Medicine | Admitting: Emergency Medicine

## 2019-10-22 ENCOUNTER — Other Ambulatory Visit: Payer: Self-pay

## 2019-10-22 DIAGNOSIS — M25512 Pain in left shoulder: Secondary | ICD-10-CM | POA: Diagnosis not present

## 2019-10-22 DIAGNOSIS — J189 Pneumonia, unspecified organism: Secondary | ICD-10-CM | POA: Diagnosis not present

## 2019-10-22 DIAGNOSIS — Z79899 Other long term (current) drug therapy: Secondary | ICD-10-CM | POA: Insufficient documentation

## 2019-10-22 DIAGNOSIS — R079 Chest pain, unspecified: Secondary | ICD-10-CM | POA: Diagnosis present

## 2019-10-22 MED ORDER — ACETAMINOPHEN 500 MG PO TABS
1000.0000 mg | ORAL_TABLET | Freq: Once | ORAL | Status: AC
  Administered 2019-10-23: 1000 mg via ORAL
  Filled 2019-10-22: qty 2

## 2019-10-22 MED ORDER — CYCLOBENZAPRINE HCL 5 MG PO TABS
5.0000 mg | ORAL_TABLET | Freq: Once | ORAL | Status: AC
  Administered 2019-10-23: 5 mg via ORAL
  Filled 2019-10-22: qty 1

## 2019-10-22 NOTE — ED Triage Notes (Signed)
Pt c/o left side CP started when she woke this am-pt is [redacted] weeks pregnant-griamcing

## 2019-10-23 ENCOUNTER — Emergency Department (HOSPITAL_BASED_OUTPATIENT_CLINIC_OR_DEPARTMENT_OTHER): Payer: 59

## 2019-10-23 LAB — COMPREHENSIVE METABOLIC PANEL
ALT: 15 U/L (ref 0–44)
AST: 13 U/L — ABNORMAL LOW (ref 15–41)
Albumin: 3.8 g/dL (ref 3.5–5.0)
Alkaline Phosphatase: 42 U/L (ref 38–126)
Anion gap: 8 (ref 5–15)
BUN: 11 mg/dL (ref 6–20)
CO2: 22 mmol/L (ref 22–32)
Calcium: 9 mg/dL (ref 8.9–10.3)
Chloride: 104 mmol/L (ref 98–111)
Creatinine, Ser: 0.58 mg/dL (ref 0.44–1.00)
GFR calc Af Amer: >60 mL/min (ref >60)
GFR calc non Af Amer: >60 mL/min (ref >60)
Glucose, Bld: 91 mg/dL (ref 70–99)
Potassium: 3.8 mmol/L (ref 3.5–5.1)
Sodium: 134 mmol/L — ABNORMAL LOW (ref 135–145)
Total Bilirubin: 0.4 mg/dL (ref 0.3–1.2)
Total Protein: 6.7 g/dL (ref 6.5–8.1)

## 2019-10-23 LAB — CBC WITH DIFFERENTIAL/PLATELET
Abs Immature Granulocytes: 0.04 10*3/uL (ref 0.00–0.07)
Basophils Absolute: 0.1 10*3/uL (ref 0.0–0.1)
Basophils Relative: 0 %
Eosinophils Absolute: 0.7 10*3/uL — ABNORMAL HIGH (ref 0.0–0.5)
Eosinophils Relative: 5 %
HCT: 39.4 % (ref 36.0–46.0)
Hemoglobin: 12.6 g/dL (ref 12.0–15.0)
Immature Granulocytes: 0 %
Lymphocytes Relative: 16 %
Lymphs Abs: 2.1 10*3/uL (ref 0.7–4.0)
MCH: 25.7 pg — ABNORMAL LOW (ref 26.0–34.0)
MCHC: 32 g/dL (ref 30.0–36.0)
MCV: 80.2 fL (ref 80.0–100.0)
Monocytes Absolute: 1.3 10*3/uL — ABNORMAL HIGH (ref 0.1–1.0)
Monocytes Relative: 10 %
Neutro Abs: 8.8 10*3/uL — ABNORMAL HIGH (ref 1.7–7.7)
Neutrophils Relative %: 69 %
Platelets: 212 10*3/uL (ref 150–400)
RBC: 4.91 MIL/uL (ref 3.87–5.11)
RDW: 14.4 % (ref 11.5–15.5)
WBC: 13 10*3/uL — ABNORMAL HIGH (ref 4.0–10.5)
nRBC: 0 % (ref 0.0–0.2)

## 2019-10-23 LAB — TROPONIN I (HIGH SENSITIVITY): Troponin I (High Sensitivity): <2 ng/L (ref <18)

## 2019-10-23 MED ORDER — AMOXICILLIN-POT CLAVULANATE 875-125 MG PO TABS
ORAL_TABLET | ORAL | Status: AC
  Filled 2019-10-23: qty 1

## 2019-10-23 MED ORDER — AZITHROMYCIN 250 MG PO TABS
500.0000 mg | ORAL_TABLET | Freq: Once | ORAL | Status: AC
  Administered 2019-10-23: 500 mg via ORAL

## 2019-10-23 MED ORDER — AZITHROMYCIN 250 MG PO TABS
ORAL_TABLET | ORAL | Status: AC
  Filled 2019-10-23: qty 2

## 2019-10-23 MED ORDER — AZITHROMYCIN 250 MG PO TABS: 250.0000 mg | ORAL_TABLET | Freq: Every day | ORAL | 0 refills | Status: DC

## 2019-10-23 MED ORDER — AMOXICILLIN-POT CLAVULANATE 875-125 MG PO TABS: 1.0000 | ORAL_TABLET | Freq: Two times a day (BID) | ORAL | 0 refills | Status: DC

## 2019-10-23 MED ORDER — AMOXICILLIN-POT CLAVULANATE 875-125 MG PO TABS
1.0000 | ORAL_TABLET | Freq: Once | ORAL | Status: AC
  Administered 2019-10-23: 01:00:00 1 via ORAL

## 2019-10-23 MED ORDER — ACETAMINOPHEN 325 MG PO TABS: 650.0000 mg | ORAL_TABLET | Freq: Four times a day (QID) | ORAL | 0 refills | Status: DC | PRN

## 2019-10-23 NOTE — ED Provider Notes (Signed)
[facility] EMERGENCY DEPARTMENT Provider Note   CSN: [id number] Arrival date & time: 10/22/19  2235     History Chief Complaint  Patient presents with  . Chest Pain    Chelsey Hess is a 44 y.o. female.  The history is provided by the patient and the spouse.  Chest Pain Pain location:  L lateral chest Pain quality: sharp   Pain radiates to:  L shoulder and L arm Pain severity:  Severe Duration:  12 hours Timing:  Constant Progression:  Worsening Chronicity:  New Context: breathing, lifting, movement and raising an arm   Relieved by:  None tried Worsened by:  Coughing, deep breathing and movement Associated symptoms: no abdominal pain, no fever, no orthopnea and no palpitations        History reviewed. No pertinent past medical history.  There are no problems to display for this patient.   Past Surgical History:  Procedure Laterality Date  . CESAREAN SECTION     x 2  . DILATATION & CURETTAGE/HYSTEROSCOPY WITH MYOSURE N/A 08/29/2018   Procedure: DILATATION & CURETTAGE/HYSTEROSCOPY WITH MYOSURE;  Surgeon: Waymon Amato, MD;  Location: [facility];  Service: Gynecology;  Laterality: N/A;     OB History    Gravida  5   Para  2   Term  2   Preterm      AB  2   Living  3     SAB  2   TAB      Ectopic      Multiple  1   Live Births  3           No family history on file.  Social History   Tobacco Use  . Smoking status: Never Smoker  . Smokeless tobacco: Never Used  Substance Use Topics  . Alcohol use: Never  . Drug use: Never    Home Medications Prior to Admission medications   Medication Sig Start Date End Date Taking? Authorizing Provider  acetaminophen (TYLENOL) 325 MG tablet Take 2 tablets (650 mg total) by mouth every 6 (six) hours as needed. 10/23/19   Aryani Daffern, Corene Cornea, MD  amoxicillin-clavulanate (AUGMENTIN) 875-125 MG tablet Take 1 tablet by mouth 2 (two) times daily. One po bid x 7 days 10/23/19   Hilberto Burzynski,  Corene Cornea, MD  azithromycin (ZITHROMAX) 250 MG tablet Take 1 tablet (250 mg total) by mouth daily. Take first 2 tablets together, then 1 every day until finished. 10/23/19   Dafney Farler, Corene Cornea, MD  Prenatal Vit-Fe Fumarate-FA (PRENATAL MULTIVITAMIN) TABS tablet Take 1 tablet by mouth daily at 12 noon.    [provider]    Allergies    Patient has no known allergies.  Review of Systems   Review of Systems  Constitutional: Negative for fever.  Cardiovascular: Positive for chest pain. Negative for palpitations and orthopnea.  Gastrointestinal: Negative for abdominal pain.  All other systems reviewed and are negative.   Physical Exam Updated Vital Signs BP 117/69 (BP Location: Right Arm)   Pulse 78   Temp 98.9 F (37.2 C) (Oral)   Resp 18   LMP 08/28/2019   SpO2 99%   Physical Exam Vitals and nursing note reviewed.  Constitutional:      Appearance: She is well-developed.  HENT:     Head: Normocephalic and atraumatic.     Mouth/Throat:     Mouth: Mucous membranes are dry.     Pharynx: Oropharynx is clear.  Eyes:     Conjunctiva/sclera: Conjunctivae  normal.  Cardiovascular:     Rate and Rhythm: Normal rate and regular rhythm.  No extrasystoles are present.    Chest Wall: PMI is not displaced.     Heart sounds: Normal heart sounds.  Pulmonary:     Effort: No respiratory distress.     Breath sounds: No stridor. No decreased breath sounds, wheezing, rhonchi or rales.  Chest:     Chest wall: No mass.  Abdominal:     General: There is no distension.  Musculoskeletal:        General: No swelling or tenderness. Normal range of motion.     Cervical back: Normal range of motion.     Right lower leg: No edema.     Left lower leg: No edema.  Skin:    General: Skin is warm and dry.     Coloration: Skin is not jaundiced.  Neurological:     General: No focal deficit present.     Mental Status: She is alert.     Cranial Nerves: No cranial nerve deficit.     Sensory: No sensory  deficit.     ED Results / Procedures / Treatments   Labs (all labs ordered are listed, but only abnormal results are displayed) Labs Reviewed  CBC WITH DIFFERENTIAL/PLATELET - Abnormal; Notable for the following components:      Result Value   WBC 13.0 (*)    MCH 25.7 (*)    Neutro Abs 8.8 (*)    Monocytes Absolute 1.3 (*)    Eosinophils Absolute 0.7 (*)    All other components within normal limits  COMPREHENSIVE METABOLIC PANEL - Abnormal; Notable for the following components:   Sodium 134 (*)    AST 13 (*)    All other components within normal limits  TROPONIN I (HIGH SENSITIVITY)  TROPONIN I (HIGH SENSITIVITY)    EKG EKG Interpretation  Date/Time:  Thursday October 22 2019 22:39:16 EDT Ventricular Rate:  90 PR Interval:  108 QRS Duration: 76 QT Interval:  348 QTC Calculation: 425 R Axis:   72 Text Interpretation: Sinus rhythm with short PR Otherwise normal ECG Confirmed by Veryl Speak [phone]) on 10/22/2019 11:30:33 PM   Radiology DG Chest Portable 1 View  Result Date: 10/23/2019 CLINICAL DATA:  Chest pain x1 day. EXAM: PORTABLE CHEST 1 VIEW COMPARISON:  December 21, 2015 FINDINGS: Mild left basilar atelectasis and/or infiltrate is noted. There is no evidence of a pleural effusion or pneumothorax. The heart size and mediastinal contours are within normal limits. The visualized skeletal structures are unremarkable. IMPRESSION: Mild left basilar atelectasis and/or infiltrate. Electronically Signed   By: Virgina Norfolk M.D.   On: 10/23/2019 00:13    Procedures Procedures (including critical care time)  Medications Ordered in ED Medications  amoxicillin-clavulanate (AUGMENTIN) 875-125 MG per tablet (has no administration in time range)  azithromycin (ZITHROMAX) 250 MG tablet (has no administration in time range)  acetaminophen (TYLENOL) tablet 1,000 mg (1,000 mg Oral Given 10/23/19 0008)  cyclobenzaprine (FLEXERIL) tablet 5 mg (5 mg Oral Given 10/23/19 0009)    amoxicillin-clavulanate (AUGMENTIN) 875-125 MG per tablet 1 tablet (1 tablet Oral Given 10/23/19 0107)  azithromycin (ZITHROMAX) tablet 500 mg (500 mg Oral Given 10/23/19 0108)    ED Course  I have reviewed the triage vital signs and the nursing notes.  Pertinent labs & imaging results that were available during my care of the patient were reviewed by me and considered in my medical decision making (see chart for details).  MDM Rules/Calculators/A&P                      xr c/w pneumonia. Leukocytosis. Will treat for same.  Considered possible PE, but no other signs of same, risk/benefit of ct scan not worth it currently. If symptoms persist after abx therapy or new sob or other symptoms then need ct scan to evaluate further.   Final Clinical Impression(s) / ED Diagnoses Final diagnoses:  Community acquired pneumonia of left lower lobe of lung    Rx / DC Orders ED Discharge Orders         Ordered    amoxicillin-clavulanate (AUGMENTIN) 875-125 MG tablet  2 times daily     10/23/19 0055    azithromycin (ZITHROMAX) 250 MG tablet  Daily     10/23/19 0055    acetaminophen (TYLENOL) 325 MG tablet  Every 6 hours PRN     10/23/19 0055           Melizza Kanode, Corene Cornea, MD 10/23/19 [phone]

## 2019-10-24 ENCOUNTER — Other Ambulatory Visit: Payer: Self-pay

## 2019-10-24 ENCOUNTER — Encounter (HOSPITAL_COMMUNITY): Payer: Self-pay | Admitting: Emergency Medicine

## 2019-10-24 ENCOUNTER — Emergency Department (HOSPITAL_COMMUNITY)
Admission: EM | Admit: 2019-10-24 | Discharge: 2019-10-24 | Disposition: A | Discharge status: Home / Self Care | Payer: 59 | Attending: Emergency Medicine | Admitting: Emergency Medicine

## 2019-10-24 DIAGNOSIS — R0602 Shortness of breath: Secondary | ICD-10-CM | POA: Diagnosis present

## 2019-10-24 DIAGNOSIS — Z5321 Procedure and treatment not carried out due to patient leaving prior to being seen by health care provider: Secondary | ICD-10-CM | POA: Insufficient documentation

## 2019-10-24 DIAGNOSIS — J189 Pneumonia, unspecified organism: Secondary | ICD-10-CM | POA: Insufficient documentation

## 2019-10-24 NOTE — ED Notes (Signed)
Pt states she does not want to wait she will come back if she sees no improvement within a couple more days

## 2019-10-24 NOTE — ED Triage Notes (Addendum)
Patient diagnosed with pneumonia at [facility] ( Thursday) prescribed with oral antibiotics with no improvement - reports persistent SOB , denies cough or fever .

## 2019-10-30 ENCOUNTER — Encounter (HOSPITAL_BASED_OUTPATIENT_CLINIC_OR_DEPARTMENT_OTHER): Payer: Self-pay | Admitting: *Deleted

## 2019-10-30 ENCOUNTER — Emergency Department (HOSPITAL_BASED_OUTPATIENT_CLINIC_OR_DEPARTMENT_OTHER)
Admission: EM | Admit: 2019-10-30 | Discharge: 2019-10-30 | Disposition: A | Discharge status: Home / Self Care | Payer: 59 | Attending: Emergency Medicine | Admitting: Emergency Medicine

## 2019-10-30 ENCOUNTER — Other Ambulatory Visit: Payer: Self-pay

## 2019-10-30 DIAGNOSIS — O2230 Deep phlebothrombosis in pregnancy, unspecified trimester: Secondary | ICD-10-CM

## 2019-10-30 DIAGNOSIS — M79602 Pain in left arm: Secondary | ICD-10-CM

## 2019-10-30 DIAGNOSIS — I82622 Acute embolism and thrombosis of deep veins of left upper extremity: Secondary | ICD-10-CM | POA: Diagnosis not present

## 2019-10-30 DIAGNOSIS — R079 Chest pain, unspecified: Secondary | ICD-10-CM | POA: Diagnosis not present

## 2019-10-30 DIAGNOSIS — Z79899 Other long term (current) drug therapy: Secondary | ICD-10-CM | POA: Insufficient documentation

## 2019-10-30 DIAGNOSIS — O2231 Deep phlebothrombosis in pregnancy, first trimester: Secondary | ICD-10-CM | POA: Insufficient documentation

## 2019-10-30 DIAGNOSIS — Z86718 Personal history of other venous thrombosis and embolism: Secondary | ICD-10-CM

## 2019-10-30 HISTORY — DX: Personal history of other venous thrombosis and embolism: Z86.718

## 2019-10-30 LAB — CBC WITH DIFFERENTIAL/PLATELET
Abs Immature Granulocytes: 0.04 10*3/uL (ref 0.00–0.07)
Basophils Absolute: 0.1 10*3/uL (ref 0.0–0.1)
Basophils Relative: 1 %
Eosinophils Absolute: 1.3 10*3/uL — ABNORMAL HIGH (ref 0.0–0.5)
Eosinophils Relative: 12 %
HCT: 38.2 % (ref 36.0–46.0)
Hemoglobin: 12.6 g/dL (ref 12.0–15.0)
Immature Granulocytes: 0 %
Lymphocytes Relative: 18 %
Lymphs Abs: 1.9 10*3/uL (ref 0.7–4.0)
MCH: 25.7 pg — ABNORMAL LOW (ref 26.0–34.0)
MCHC: 33 g/dL (ref 30.0–36.0)
MCV: 77.8 fL — ABNORMAL LOW (ref 80.0–100.0)
Monocytes Absolute: 0.7 10*3/uL (ref 0.1–1.0)
Monocytes Relative: 7 %
Neutro Abs: 6.5 10*3/uL (ref 1.7–7.7)
Neutrophils Relative %: 62 %
Platelets: 240 10*3/uL (ref 150–400)
RBC: 4.91 MIL/uL (ref 3.87–5.11)
RDW: 14.3 % (ref 11.5–15.5)
WBC: 10.5 10*3/uL (ref 4.0–10.5)
nRBC: 0 % (ref 0.0–0.2)

## 2019-10-30 LAB — BASIC METABOLIC PANEL
Anion gap: 10 (ref 5–15)
BUN: 12 mg/dL (ref 6–20)
CO2: 20 mmol/L — ABNORMAL LOW (ref 22–32)
Calcium: 9.4 mg/dL (ref 8.9–10.3)
Chloride: 107 mmol/L (ref 98–111)
Creatinine, Ser: 0.6 mg/dL (ref 0.44–1.00)
GFR calc Af Amer: >60 mL/min (ref >60)
GFR calc non Af Amer: >60 mL/min (ref >60)
Glucose, Bld: 134 mg/dL — ABNORMAL HIGH (ref 70–99)
Potassium: 3.4 mmol/L — ABNORMAL LOW (ref 3.5–5.1)
Sodium: 137 mmol/L (ref 135–145)

## 2019-10-30 MED ORDER — ENOXAPARIN SODIUM 80 MG/0.8ML ~~LOC~~ SOLN
1.0000 mg/kg | Freq: Once | SUBCUTANEOUS | Status: AC
  Administered 2019-10-30: 80 mg via SUBCUTANEOUS
  Filled 2019-10-30: qty 0.8

## 2019-10-30 MED ORDER — ENOXAPARIN (LOVENOX) PATIENT EDUCATION KIT
PACK | Freq: Once | Status: AC
  Filled 2019-10-30: qty 1

## 2019-10-30 MED ORDER — ENOXAPARIN SODIUM 80 MG/0.8ML ~~LOC~~ SOLN: 1.0000 mg/kg | Freq: Two times a day (BID) | SUBCUTANEOUS | 0 refills | Status: DC

## 2019-10-30 NOTE — ED Provider Notes (Addendum)
[facility] EMERGENCY DEPARTMENT Provider Note   CSN: [id number] Arrival date & time: 10/30/19  1332     History Chief Complaint  Patient presents with  . Arm Pain    Chelsey Hess is a 44 y.o. female who is currently [redacted] weeks pregnant presents to the ED with complaint of left sided arm pain x 1.5 weeks. Per chart review pt was seen in the ED on 04/08 with complaint of left lateral chest/left arm pain. ED workup included: EKG, CXR, CBC, CMP, and Troponin. CXR showed mild left basilar atelectasis and/or infiltrate. Pt was treated with Augmentin and Azithromycin and discharged home. Pt reports the chest pain improved with the antibiotics but her arm pain became worse. Pt returned to [facility] on 04/10 however LWBS due to wait time. She went to her PCP on 04/16 who scheduled an outpatient ultrasound of her left arm with findings of thrombus in the brachial and basilic vein. Pt called PCP's office to discuss results and given complaint of intermittent chest pain (even though improved from ED visit initially) was told to come back to the ED with concern for a PE. Pt states the chest pain is intermittent now and describes it as a "poking" sensation. She denies fevers, chills, cough, hemoptysis, leg swelling, or any other associated symptoms. No hx of DVT/PE in the past.   The history is provided by the patient and medical records.       History reviewed. No pertinent past medical history.  There are no problems to display for this patient.   Past Surgical History:  Procedure Laterality Date  . CESAREAN SECTION     x 2  . DILATATION & CURETTAGE/HYSTEROSCOPY WITH MYOSURE N/A 08/29/2018   Procedure: DILATATION & CURETTAGE/HYSTEROSCOPY WITH MYOSURE;  Surgeon: Waymon Amato, MD;  Location: [facility];  Service: Gynecology;  Laterality: N/A;     OB History    Gravida  5   Para  2   Term  2   Preterm      AB  2   Living  3     SAB  2   TAB      Ectopic      Multiple  1   Live Births  3           No family history on file.  Social History   Tobacco Use  . Smoking status: Never Smoker  . Smokeless tobacco: Never Used  Substance Use Topics  . Alcohol use: Never  . Drug use: Never    Home Medications Prior to Admission medications   Medication Sig Start Date End Date Taking? Authorizing Provider  Prenatal Vit-Fe Fumarate-FA (PRENATAL MULTIVITAMIN) TABS tablet Take 1 tablet by mouth daily at 12 noon.   Yes [provider]  acetaminophen (TYLENOL) 325 MG tablet Take 2 tablets (650 mg total) by mouth every 6 (six) hours as needed. 10/23/19   Mesner, Corene Cornea, MD  amoxicillin-clavulanate (AUGMENTIN) 875-125 MG tablet Take 1 tablet by mouth 2 (two) times daily. One po bid x 7 days 10/23/19   Mesner, Corene Cornea, MD  azithromycin (ZITHROMAX) 250 MG tablet Take 1 tablet (250 mg total) by mouth daily. Take first 2 tablets together, then 1 every day until finished. 10/23/19   Mesner, Corene Cornea, MD  enoxaparin (LOVENOX) 80 MG/0.8ML injection Inject 0.8 mLs (80 mg total) into the skin every 12 (twelve) hours. 10/30/19 11/29/19  Eustaquio Maize, PA-C    Allergies    Patient has no known  allergies.  Review of Systems   Review of Systems  Constitutional: Negative for chills and fever.  Respiratory: Negative for cough and shortness of breath.   Cardiovascular: Positive for chest pain.  Musculoskeletal: Positive for arthralgias.  All other systems reviewed and are negative.   Physical Exam Updated Vital Signs BP 115/63 (BP Location: Right Arm)   Pulse 75   Temp 98 F (36.7 C) (Oral)   Resp 16   Ht 5' 8" (1.727 m)   Wt 80 kg   LMP 08/28/2019   SpO2 98%   BMI 26.82 kg/m   Physical Exam Vitals and nursing note reviewed.  Constitutional:      Appearance: She is not ill-appearing.  HENT:     Head: Normocephalic and atraumatic.  Eyes:     Conjunctiva/sclera: Conjunctivae normal.  Cardiovascular:     Rate and Rhythm: Normal rate and regular  rhythm.     Pulses: Normal pulses.  Pulmonary:     Effort: Pulmonary effort is normal.     Breath sounds: Normal breath sounds. No wheezing, rhonchi or rales.  Abdominal:     Palpations: Abdomen is soft.     Tenderness: There is no abdominal tenderness.  Musculoskeletal:     Cervical back: Neck supple.     Comments: Left arm significantly more edematous compared to right arm with TTP of the upper arm medially. Ecchymosis noted to the [facility] area from IV access during last ED visit. 2+ radial pulse. ROM intact to joints of LUE. Sensation and strength intact throughout.  Skin:    General: Skin is warm and dry.  Neurological:     Mental Status: She is alert.     ED Results / Procedures / Treatments   Labs (all labs ordered are listed, but only abnormal results are displayed) Labs Reviewed  BASIC METABOLIC PANEL - Abnormal; Notable for the following components:      Result Value   Potassium 3.4 (*)    CO2 20 (*)    Glucose, Bld 134 (*)    All other components within normal limits  CBC WITH DIFFERENTIAL/PLATELET - Abnormal; Notable for the following components:   MCV 77.8 (*)    MCH 25.7 (*)    Eosinophils Absolute 1.3 (*)    All other components within normal limits    EKG None  Radiology No results found.  Procedures Procedures (including critical care time)  Medications Ordered in ED Medications  enoxaparin (LOVENOX) injection 80 mg (has no administration in time range)  enoxaparin (LOVENOX) patient education kit (has no administration in time range)    ED Course  I have reviewed the triage vital signs and the nursing notes.  Pertinent labs & imaging results that were available during my care of the patient were reviewed by me and considered in my medical decision making (see chart for details).  Clinical Course as of Oct 29 1601  Fri Oct 30, 2019  1519 Creatinine: 0.60 [MV]  1546 ervin   [MV]    Clinical Course User Index [MV] Eustaquio Maize, [place]   MDM  Rules/Calculators/A&P                     44 year old female who is currently [redacted] weeks pregnant presents the ED from PCPs office with new diagnosis of blood clot in the left brachial vein and basilic vein.  Was seen in the ED 1 week ago for chest pain and left arm pain and diagnosed with pneumonia, reports chest  pain has improved since then however sometimes will have intermittent "needling" pain. On arrival pt is afebrile, nontachycardic, and nontachypneic. She is in NAD. Given pt is currently [redacted] weeks pregnant will consult vascular surgery in regards to whether CTA is necessary at this time. Have discussed this with attending physician Dr. Wilson Singer who agrees with plan. Will obtain screening labs to check creatinine prior to starting anticoagulation.   Discussed case with vascular surgeon Dr. Carlis Abbott who recommends treating for her DVT; given pt is stable does not think CTA is necessary as it would be treated the same way however stipulates that he cannot evaluate patient and will leave that decision with [place]. Attending physician Dr. Wilson Singer and I do not feel CTA is necessary at this time. Pt is nontachycardic and appears to be in NAD. Will discharge with Xarelto with close OBGYN follow up; pt has appointment scheduled for Monday. If any worsening symptoms including worsening chest pain or shortness of breath pt will need to come to the ED Immediately for further eval. Pt and husband feel comfortable with this plan as they are concerned about the radiation to the baby.   Creatinine WNL at 0.60.   Discussed case with our pharmacist on site Hank Z Ramirez-Perez who recommends Lovenox given pt currently pregnant as eliquis and xarelto unsafe so early on in her pregnancy.  Discussed case with Dr. Clayborne Artist on call at [facility] who agrees with Lovenox for safety concerns.  Pt given first dose here to take home and instructed to take at 8 PM tonight. She is discharged with 1 months worth 1 mg/kg BID. Pt advised to follow up with  OBGYN as well as PCP regarding her ED visit today. They will need to represcribe the rest. Pt may have to be anticoagulated for remainder of pregnancy however will let her providers discuss this outpatient. Pt instructed to return to the ED IMMEDIATELY for any worsening symptoms including worsening chest pain, SOB, palpitations, passing out. She is in understanding that if she gets worse she may need a CTA at that point. Pt stable for discharge home at this time. She has an OBGYN appt scheduled for Monday.   This note was prepared using Dragon voice recognition software and may include unintentional dictation errors due to the inherent limitations of voice recognition software.  Final Clinical Impression(s) / ED Diagnoses Final diagnoses:  Left arm pain  Chest pain, unspecified type  DVT (deep vein thrombosis) in pregnancy    Rx / DC Orders ED Discharge Orders         Ordered    enoxaparin (LOVENOX) 80 MG/0.8ML injection  Every 12 hours     10/30/19 1557           Discharge Instructions     Please follow up with both your OBGYN and your PCP regarding your ED visit today Pick up the medication and take as prescribed. You have enough for 1 month however either your PCP or OBGYN will need to prescribe the rest of the refills.  Return to the ED IMMEDIATELY for any worsening symptoms including worsening chest pain, shortness of breath, rapid heart rate, passing out, excessive bleeding, or any other associated symptoms       Eustaquio Maize, PA-C 10/30/19 1603    Virgel Manifold, MD 10/31/19 [address], [place], PA-C 11/09/19 3419    Virgel Manifold, MD 11/13/19 [phone]

## 2019-10-30 NOTE — Discharge Instructions (Signed)
Please follow up with both your OBGYN and your PCP regarding your ED visit today Pick up the medication and take as prescribed. You have enough for 1 month however either your PCP or OBGYN will need to prescribe the rest of the refills.  Return to the ED IMMEDIATELY for any worsening symptoms including worsening chest pain, shortness of breath, rapid heart rate, passing out, excessive bleeding, or any other associated symptoms

## 2019-10-30 NOTE — ED Notes (Signed)
ED Provider at bedside. 

## 2019-10-30 NOTE — ED Triage Notes (Addendum)
Recent diagnosis of pneumonia. Here today for pain in her left arm with possible blood cot in her arm. She is [redacted] weeks pregnant.

## 2019-10-30 NOTE — ED Notes (Signed)
Teaching provided for self injection of lovenox.  Pt and husband demonstrated self injection and able to verbalize situations that would require them to seek return to ER

## 2019-11-03 ENCOUNTER — Encounter (HOSPITAL_BASED_OUTPATIENT_CLINIC_OR_DEPARTMENT_OTHER): Payer: Self-pay | Admitting: *Deleted

## 2019-11-03 ENCOUNTER — Other Ambulatory Visit: Payer: Self-pay

## 2019-11-03 ENCOUNTER — Emergency Department (HOSPITAL_BASED_OUTPATIENT_CLINIC_OR_DEPARTMENT_OTHER)
Admission: EM | Admit: 2019-11-03 | Discharge: 2019-11-04 | Disposition: A | Discharge status: Home / Self Care | Payer: 59 | Attending: Emergency Medicine | Admitting: Emergency Medicine

## 2019-11-03 ENCOUNTER — Emergency Department (HOSPITAL_BASED_OUTPATIENT_CLINIC_OR_DEPARTMENT_OTHER): Payer: 59

## 2019-11-03 DIAGNOSIS — O2341 Unspecified infection of urinary tract in pregnancy, first trimester: Secondary | ICD-10-CM | POA: Diagnosis present

## 2019-11-03 DIAGNOSIS — Z79899 Other long term (current) drug therapy: Secondary | ICD-10-CM | POA: Diagnosis not present

## 2019-11-03 DIAGNOSIS — O26891 Other specified pregnancy related conditions, first trimester: Secondary | ICD-10-CM | POA: Diagnosis not present

## 2019-11-03 DIAGNOSIS — M549 Dorsalgia, unspecified: Secondary | ICD-10-CM

## 2019-11-03 DIAGNOSIS — R519 Headache, unspecified: Secondary | ICD-10-CM | POA: Diagnosis not present

## 2019-11-03 DIAGNOSIS — Z3A09 9 weeks gestation of pregnancy: Secondary | ICD-10-CM | POA: Insufficient documentation

## 2019-11-03 MED ORDER — METOCLOPRAMIDE HCL 5 MG/ML IJ SOLN
10.0000 mg | Freq: Once | INTRAMUSCULAR | Status: AC
  Administered 2019-11-03: 10 mg via INTRAVENOUS
  Filled 2019-11-03: qty 2

## 2019-11-03 MED ORDER — DIPHENHYDRAMINE HCL 50 MG/ML IJ SOLN
25.0000 mg | Freq: Once | INTRAMUSCULAR | Status: AC
  Administered 2019-11-03: 25 mg via INTRAVENOUS
  Filled 2019-11-03: qty 1

## 2019-11-03 MED ORDER — SODIUM CHLORIDE 0.9 % IV BOLUS (SEPSIS)
1000.0000 mL | Freq: Once | INTRAVENOUS | Status: AC
  Administered 2019-11-03: 1000 mL via INTRAVENOUS

## 2019-11-03 MED ORDER — ACETAMINOPHEN 500 MG PO TABS
1000.0000 mg | ORAL_TABLET | Freq: Once | ORAL | Status: AC
  Administered 2019-11-03: 1000 mg via ORAL
  Filled 2019-11-03: qty 2

## 2019-11-03 NOTE — ED Notes (Signed)
Pt ambulated to bathroom without difficulty.

## 2019-11-03 NOTE — ED Notes (Signed)
Pt transported to radiology.

## 2019-11-03 NOTE — ED Provider Notes (Signed)
TIME SEEN: 11:32 PM  CHIEF COMPLAINT: Headache, back pain  HPI: Patient is a 44 year old female who is a G5, P2 approximately 9 weeks 4 days pregnant based on LMP of February 12 who presents to the emergency department with complaints of bilateral upper back pain that has been ongoing for 2 to 3 weeks.  States initially pain was in the left mid back and she was diagnosed with left lower lobe pneumonia on 10/22/2019.  She finished a course of Augmentin and azithromycin and states that her symptoms improved.  She did have shortness of breath when she had this pain but no fevers or cough.  She denies having shortness of breath or chest pain currently.  She has been tested for Covid by her PCP and was negative.  She states that she is still having some left mid back pain but is now moved into the right side as well.  No injury to her back.  No numbness, tingling or focal weakness.  No bowel or bladder incontinence.  She was seen in the emergency department for left upper extremity pain on 10/30/2019 and was diagnosed with a DVT.  She did not have any injury to this arm or recent procedures, IVs.  She was started on Lovenox which she reports compliance.  At that time it was deemed that patient did not need a CTA of her chest to rule out PE given she was started on treatment.  She is concerned that she continues to have back pain.  She also reports today she has had a headache all day long but did not improve with Tylenol.  She has had similar headaches before.  No head injury.  She reports she has had vaginal bleeding since she became pregnant that is unchanged.  She saw her OB/GYN yesterday at [facility] and was diagnosed with a "hematoma".  No abnormal vaginal discharge or abdominal pain.  Patient declines [place] interpreter.  History obtained from patient and husband at bedside.  ROS: See HPI Constitutional: no fever  Eyes: no drainage  ENT: no runny nose   Cardiovascular:  no chest pain  Resp:  no SOB  GI: no vomiting GU: no dysuria Integumentary: no rash  Allergy: no hives  Musculoskeletal: no leg swelling  Neurological: no slurred speech ROS otherwise negative  PAST MEDICAL HISTORY/PAST SURGICAL HISTORY:  History reviewed. No pertinent past medical history.  MEDICATIONS:  Prior to Admission medications   Medication Sig Start Date End Date Taking? Authorizing Provider  acetaminophen (TYLENOL) 325 MG tablet Take 2 tablets (650 mg total) by mouth every 6 (six) hours as needed. 10/23/19   Mesner, Corene Cornea, MD  amoxicillin-clavulanate (AUGMENTIN) 875-125 MG tablet Take 1 tablet by mouth 2 (two) times daily. One po bid x 7 days 10/23/19   Mesner, Corene Cornea, MD  azithromycin (ZITHROMAX) 250 MG tablet Take 1 tablet (250 mg total) by mouth daily. Take first 2 tablets together, then 1 every day until finished. 10/23/19   Mesner, Corene Cornea, MD  enoxaparin (LOVENOX) 80 MG/0.8ML injection Inject 0.8 mLs (80 mg total) into the skin every 12 (twelve) hours. 10/30/19 11/29/19  Eustaquio Maize, PA-C  Prenatal Vit-Fe Fumarate-FA (PRENATAL MULTIVITAMIN) TABS tablet Take 1 tablet by mouth daily at 12 noon.    [provider]    ALLERGIES:  No Known Allergies  SOCIAL HISTORY:  Social History   Tobacco Use  . Smoking status: Never Smoker  . Smokeless tobacco: Never Used  Substance Use Topics  . Alcohol use: Never  FAMILY HISTORY: History reviewed. No pertinent family history.  EXAM: BP 116/64 (BP Location: Right Arm)   Pulse 84   Temp 98.4 F (36.9 C) (Oral)   Resp 19   Ht 5\' 8"  (1.727 m)   Wt 73.2 kg   LMP 08/28/2019   SpO2 100%   BMI 24.53 kg/m  CONSTITUTIONAL: Alert and oriented and responds appropriately to questions. Well-appearing; well-nourished, afebrile, nontoxic, well-hydrated but appears anxious HEAD: Normocephalic, atraumatic EYES: Conjunctivae clear, pupils appear equal, EOM appear intact ENT: normal nose; moist mucous membranes NECK: Supple, normal ROM, no midline  spinal tenderness or step-off or deformity CARD: RRR; S1 and S2 appreciated; no murmurs, no clicks, no rubs, no gallops RESP: Normal chest excursion without splinting or tachypnea; breath sounds clear and equal bilaterally; no wheezes, no rhonchi, no rales, no hypoxia or respiratory distress, speaking full sentences ABD/GI: Normal bowel sounds; non-distended; soft, non-tender, no rebound, no guarding, no peritoneal signs, no hepatosplenomegaly BACK:  The back appears normal, no midline spinal tenderness or step-off or deformity.  Patient is tender to palpation over the thoracic paraspinal muscles bilaterally without redness, warmth, ecchymosis, rash, other lesions, soft tissue swelling. EXT: Normal ROM in all joints; no deformity noted, no edema; no cyanosis, no significant swelling noted to the left upper extremity and she has a 2+ left radial pulse, compartments in the left arm are soft, no redness or warmth, no ecchymosis or soft tissue swelling noted; no calf tenderness or calf swelling SKIN: Normal color for age and race; warm; no rash on exposed skin NEURO: Moves all extremities equally, normal sensation diffusely, normal gait, normal speech no facial asymmetry PSYCH: The patient's mood and manner are appropriate.   MEDICAL DECISION MAKING: Patient here with complaints of continued back pain.  Pain may be musculoskeletal in nature but she is concerned because when this pain started she was diagnosed with left lower lobe pneumonia.  Reports she has finished antibiotics at the end of last week and states that her symptoms on the left side improved but is now having pain on the right side.  She was just recently diagnosed with a DVT in the left upper extremity reports compliance with Lovenox.  She has no midline spinal tenderness, back injury or neurologic deficits.  Lengthy discussion with patient and husband at bedside that there is always a possibility this could be a pulmonary embolus.  We have  discussed risk and benefits of CT imaging for further evaluation and they would prefer to avoid CT imaging at this time which I feel is reasonable given she is already on treatment.  She is not hypoxic, hypotensive, tachycardic, tachypneic here.  She would like a repeat chest x-ray to ensure her pneumonia has resolved and to ensure she does not have recurrent pneumonia.  I feel this is reasonable.  We did discuss that there is some radiation exposure with an x-ray but definitely not as much as a CT scan.  Her back pain may be musculoskeletal in nature as I am able to reproduce some of it with palpation but she has no concerning red flag symptoms to suggest cauda equina, epidural abscess or hematoma, discitis or osteomyelitis, transverse myelitis, fracture.  Will obtain chest x-ray, EKG, labs and urinalysis.  Will give Tylenol for pain.  She is also complaining of headache but states she has had similar headaches before.  She describes these as throbbing, unilateral, lasting for days.  It seems like she likely has migraines.  She denies any head  injury.  I have low suspicion for intracranial hemorrhage, CVT, stroke at this time.  She is neurologically intact here.  Will give Reglan, Benadryl, IV fluids.  ED PROGRESS: Patient reports headache and back pain are gone after above medications.  Her chest x-ray has been reviewed/interpreted and shows that her left lower lobe pneumonia has resolved and there are no acute abnormalities today.  EKG reviewed/interpreted and shows no ischemic abnormality or arrhythmia.  Her urine does show pyuria without significant bacteria but she does have many squamous cells and this could be a dirty catch.  Discussed this with patient and husband but have recommended treatment.  She received a dose of ceftriaxone here in the emergency department and will discharge with Keflex with close outpatient follow-up.  Given urine is very mild and she is not having fevers, vomiting or urinary  symptoms, I feel pyelonephritis is less likely.  Patient and husband are comfortable with this plan.  They will continue Lovenox as prescribed.  Recommended over-the-counter Tylenol as needed for pain.  Labs reviewed/interpreted and are reassuring today without significant electrolyte derangement, anemia.  Normal renal function.  At this time, I do not feel there is any life-threatening condition present. I have reviewed, interpreted and discussed all results (EKG, imaging, lab, urine as appropriate) and exam findings with patient/family. I have reviewed nursing notes and appropriate previous records.  I feel the patient is safe to be discharged home without further emergent workup and can continue workup as an outpatient as needed. Discussed usual and customary return precautions. Patient/family verbalize understanding and are comfortable with this plan.  Outpatient follow-up has been provided as needed. All questions have been answered.     EKG Interpretation  Date/Time:  Wednesday November 04 2019 00:00:37 EDT Ventricular Rate:  94 PR Interval:    QRS Duration: 91 QT Interval:  357 QTC Calculation: 447 R Axis:   71 Text Interpretation: Age not entered, assumed to be  44 years old for purpose of ECG interpretation Sinus rhythm Borderline short PR interval Minimal ST depression, inferior leads No significant change since last tracing Confirmed by Pryor Curia [phone]) on 11/04/2019 12:06:22 AM         Chelsey Hess was evaluated in Emergency Department on 11/03/2019 for the symptoms described in the history of present illness. She was evaluated in the context of the global COVID-19 pandemic, which necessitated consideration that the patient might be at risk for infection with the SARS-CoV-2 virus that causes COVID-19. Institutional protocols and algorithms that pertain to the evaluation of patients at risk for COVID-19 are in a state of rapid change based on information released by regulatory  bodies including the CDC and federal and state organizations. These policies and algorithms were followed during the patient's care in the ED.      Montario Zilka, Delice Bison, DO 11/04/19 [phone]

## 2019-11-03 NOTE — ED Triage Notes (Signed)
Pt reports that she was dx with pna several weeks ago. Reports that the back pain continues. Denies chest pain. Pt ambulatory. Pt reports that she is tired-also states that she [redacted] weeks pregnant. Pt dx with a blood clot in her arm 4 days ago, given xarelto-compliant with med.  Reports HA that began today-states that she took 1 tylenol today.

## 2019-11-04 LAB — CBC WITH DIFFERENTIAL/PLATELET
Abs Immature Granulocytes: 0.04 10*3/uL (ref 0.00–0.07)
Basophils Absolute: 0.1 10*3/uL (ref 0.0–0.1)
Basophils Relative: 1 %
Eosinophils Absolute: 0.6 10*3/uL — ABNORMAL HIGH (ref 0.0–0.5)
Eosinophils Relative: 6 %
HCT: 36.6 % (ref 36.0–46.0)
Hemoglobin: 12.1 g/dL (ref 12.0–15.0)
Immature Granulocytes: 0 %
Lymphocytes Relative: 36 %
Lymphs Abs: 3.5 10*3/uL (ref 0.7–4.0)
MCH: 25.8 pg — ABNORMAL LOW (ref 26.0–34.0)
MCHC: 33.1 g/dL (ref 30.0–36.0)
MCV: 78 fL — ABNORMAL LOW (ref 80.0–100.0)
Monocytes Absolute: 0.9 10*3/uL (ref 0.1–1.0)
Monocytes Relative: 9 %
Neutro Abs: 4.7 10*3/uL (ref 1.7–7.7)
Neutrophils Relative %: 48 %
Platelets: 230 10*3/uL (ref 150–400)
RBC: 4.69 MIL/uL (ref 3.87–5.11)
RDW: 14.3 % (ref 11.5–15.5)
WBC: 9.9 10*3/uL (ref 4.0–10.5)
nRBC: 0 % (ref 0.0–0.2)

## 2019-11-04 LAB — COMPREHENSIVE METABOLIC PANEL
ALT: 36 U/L (ref 0–44)
AST: 35 U/L (ref 15–41)
Albumin: 3.4 g/dL — ABNORMAL LOW (ref 3.5–5.0)
Alkaline Phosphatase: 38 U/L (ref 38–126)
Anion gap: 10 (ref 5–15)
BUN: 8 mg/dL (ref 6–20)
CO2: 19 mmol/L — ABNORMAL LOW (ref 22–32)
Calcium: 8.7 mg/dL — ABNORMAL LOW (ref 8.9–10.3)
Chloride: 107 mmol/L (ref 98–111)
Creatinine, Ser: 0.51 mg/dL (ref 0.44–1.00)
GFR calc Af Amer: >60 mL/min (ref >60)
GFR calc non Af Amer: >60 mL/min (ref >60)
Glucose, Bld: 95 mg/dL (ref 70–99)
Potassium: 3.2 mmol/L — ABNORMAL LOW (ref 3.5–5.1)
Sodium: 136 mmol/L (ref 135–145)
Total Bilirubin: 0.3 mg/dL (ref 0.3–1.2)
Total Protein: 6.3 g/dL — ABNORMAL LOW (ref 6.5–8.1)

## 2019-11-04 LAB — URINALYSIS, ROUTINE W REFLEX MICROSCOPIC
Bilirubin Urine: NEGATIVE
Glucose, UA: NEGATIVE mg/dL
Ketones, ur: 15 mg/dL — AB
Nitrite: NEGATIVE
Protein, ur: NEGATIVE mg/dL
Specific Gravity, Urine: <1.005 — ABNORMAL LOW (ref 1.005–1.030)
pH: 6 (ref 5.0–8.0)

## 2019-11-04 LAB — URINALYSIS, MICROSCOPIC (REFLEX)

## 2019-11-04 MED ORDER — SODIUM CHLORIDE 0.9 % IV SOLN
1.0000 g | Freq: Once | INTRAVENOUS | Status: AC
  Administered 2019-11-04: 1 g via INTRAVENOUS
  Filled 2019-11-04: qty 10

## 2019-11-04 MED ORDER — CEPHALEXIN 500 MG PO CAPS: 500.0000 mg | ORAL_CAPSULE | Freq: Two times a day (BID) | ORAL | 0 refills | Status: DC

## 2019-11-04 NOTE — Discharge Instructions (Addendum)
You may continue Tylenol 1000 mg every 6 hours as needed for pain.  Please increase your fluid intake at home.  Please follow-up closely with your primary care physician.  Please continue your Lovenox as prescribed.

## 2019-11-05 LAB — URINE CULTURE: Culture: NO GROWTH

## 2019-11-30 ENCOUNTER — Other Ambulatory Visit: Payer: Self-pay | Admitting: Family

## 2019-11-30 ENCOUNTER — Other Ambulatory Visit: Payer: Self-pay

## 2019-11-30 DIAGNOSIS — D6859 Other primary thrombophilia: Secondary | ICD-10-CM

## 2019-12-01 ENCOUNTER — Other Ambulatory Visit: Payer: Self-pay

## 2019-12-01 ENCOUNTER — Inpatient Hospital Stay: Payer: 59 | Attending: Family | Admitting: Family

## 2019-12-01 ENCOUNTER — Inpatient Hospital Stay: Payer: 59

## 2019-12-01 VITALS — BP 95/50 | HR 100 | Temp 97.7°F | Resp 18 | Wt 160.4 lb

## 2019-12-01 DIAGNOSIS — E7849 Other hyperlipidemia: Secondary | ICD-10-CM

## 2019-12-01 DIAGNOSIS — Z7901 Long term (current) use of anticoagulants: Secondary | ICD-10-CM | POA: Insufficient documentation

## 2019-12-01 DIAGNOSIS — D6859 Other primary thrombophilia: Secondary | ICD-10-CM

## 2019-12-01 DIAGNOSIS — Z86718 Personal history of other venous thrombosis and embolism: Secondary | ICD-10-CM | POA: Diagnosis not present

## 2019-12-01 DIAGNOSIS — Z3A13 13 weeks gestation of pregnancy: Secondary | ICD-10-CM | POA: Diagnosis not present

## 2019-12-01 LAB — CBC WITH DIFFERENTIAL (CANCER CENTER ONLY)
Abs Immature Granulocytes: 0.17 10*3/uL — ABNORMAL HIGH (ref 0.00–0.07)
Basophils Absolute: 0 10*3/uL (ref 0.0–0.1)
Basophils Relative: 1 %
Eosinophils Absolute: 0.3 10*3/uL (ref 0.0–0.5)
Eosinophils Relative: 3 %
HCT: 38.2 % (ref 36.0–46.0)
Hemoglobin: 12.8 g/dL (ref 12.0–15.0)
Immature Granulocytes: 2 %
Lymphocytes Relative: 25 %
Lymphs Abs: 2.1 10*3/uL (ref 0.7–4.0)
MCH: 26.2 pg (ref 26.0–34.0)
MCHC: 33.5 g/dL (ref 30.0–36.0)
MCV: 78.1 fL — ABNORMAL LOW (ref 80.0–100.0)
Monocytes Absolute: 0.6 10*3/uL (ref 0.1–1.0)
Monocytes Relative: 8 %
Neutro Abs: 5.3 10*3/uL (ref 1.7–7.7)
Neutrophils Relative %: 61 %
Platelet Count: 196 10*3/uL (ref 150–400)
RBC: 4.89 MIL/uL (ref 3.87–5.11)
RDW: 14.4 % (ref 11.5–15.5)
WBC Count: 8.5 10*3/uL (ref 4.0–10.5)
nRBC: 0 % (ref 0.0–0.2)

## 2019-12-01 LAB — RETICULOCYTES
Immature Retic Fract: 14.5 % (ref 2.3–15.9)
RBC.: 4.92 MIL/uL (ref 3.87–5.11)
Retic Count, Absolute: 79.2 10*3/uL (ref 19.0–186.0)
Retic Ct Pct: 1.6 % (ref 0.4–3.1)

## 2019-12-01 LAB — CMP (CANCER CENTER ONLY)
ALT: 12 U/L (ref 0–44)
AST: 10 U/L — ABNORMAL LOW (ref 15–41)
Albumin: 4 g/dL (ref 3.5–5.0)
Alkaline Phosphatase: 33 U/L — ABNORMAL LOW (ref 38–126)
Anion gap: 11 (ref 5–15)
BUN: 12 mg/dL (ref 6–20)
CO2: 22 mmol/L (ref 22–32)
Calcium: 9.3 mg/dL (ref 8.9–10.3)
Chloride: 103 mmol/L (ref 98–111)
Creatinine: 0.9 mg/dL (ref 0.44–1.00)
GFR, Est AFR Am: >60 mL/min (ref >60)
GFR, Estimated: >60 mL/min (ref >60)
Glucose, Bld: 126 mg/dL — ABNORMAL HIGH (ref 70–99)
Potassium: 3.5 mmol/L (ref 3.5–5.1)
Sodium: 136 mmol/L (ref 135–145)
Total Bilirubin: 0.2 mg/dL — ABNORMAL LOW (ref 0.3–1.2)
Total Protein: 6 g/dL — ABNORMAL LOW (ref 6.5–8.1)

## 2019-12-01 LAB — LIPID PANEL
Cholesterol: 269 mg/dL — ABNORMAL HIGH (ref 0–200)
HDL: 47 mg/dL (ref >40)
LDL Cholesterol: UNDETERMINED mg/dL (ref 0–99)
Total CHOL/HDL Ratio: 5.7 RATIO
Triglycerides: 545 mg/dL — ABNORMAL HIGH (ref <150)
VLDL: UNDETERMINED mg/dL (ref 0–40)

## 2019-12-01 LAB — LACTATE DEHYDROGENASE: LDH: 123 U/L (ref 98–192)

## 2019-12-01 LAB — ANTITHROMBIN III: AntiThromb III Func: 93 % (ref 75–120)

## 2019-12-01 LAB — SAVE SMEAR(SSMR), FOR PROVIDER SLIDE REVIEW

## 2019-12-01 NOTE — Progress Notes (Signed)
Hematology/Oncology Consultation   Name: Chelsey Hess      MRN: [id number]    Location: Room/bed info not found  Date: 12/01/2019 Time:1:57 PM   REFERRING PHYSICIAN: Eula Flax, MD  REASON FOR CONSULT: [name] is a very pleasant 44 yo [place] female with recent history of DVT in the left brachial veins diagnosed on 10/30/2019. No previous history of thrombus. She is 13 (almost 14) weeks pregnant and has started treatment with Lovenox 80 mg SQ BID.  She is still fatigued, notes occasional palpitations and has some n/v throughout the day which seems to have improved.  She is due in mid November but will have a scheduled C-section the last week of October.  She has 3 children and history of 2 miscarriages within the first 8 weeks.  No family histroy of miscarriages that she is aware of.  She initially had some vaginal blood spotting but since starting the blood thinner this has almost completely resolved. Her OB is aware and monitoring.  No other blood loss noted. She does have some bruising at injection sites along the abdomen.  She states that her father had surgery last year for a kidney issue and developed a blood clot at that time.  We noted hyperlipidemia in the tubes of blood drawn today and have added a lipid panel.  No fever, chills, cough, rash, dizziness, SOB, chest pain, abdominal pain or changes in bladder habits.  She is taking Miralax as needed for constipation.  No swelling, tenderness, numbness or tingling in her extremities at this time.  No falls or syncopal episodes.  She has a good appetite and is staying well hydrated. Her weight is stable.  No smoking, ETOH or recreational drug use.  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    DIAGNOSIS: No diagnosis found.  HISTORY OF PRESENT ILLNESS:   ROS: All other 10 point review of systems is negative.   PAST MEDICAL HISTORY:   No past medical history on  file.  ALLERGIES: No Known Allergies    MEDICATIONS:  Current Outpatient Medications on File Prior to Visit  Medication Sig Dispense Refill  . acetaminophen (TYLENOL) 325 MG tablet Take 2 tablets (650 mg total) by mouth every 6 (six) hours as needed. 30 tablet 0  . amoxicillin-clavulanate (AUGMENTIN) 875-125 MG tablet Take 1 tablet by mouth 2 (two) times daily. One po bid x 7 days 14 tablet 0  . azithromycin (ZITHROMAX) 250 MG tablet Take 1 tablet (250 mg total) by mouth daily. Take first 2 tablets together, then 1 every day until finished. 6 tablet 0  . cephALEXin (KEFLEX) 500 MG capsule Take 1 capsule (500 mg total) by mouth 2 (two) times daily. 14 capsule 0  . enoxaparin (LOVENOX) 80 MG/0.8ML injection Inject 0.8 mLs (80 mg total) into the skin every 12 (twelve) hours. 48 mL 0  . Prenatal Vit-Fe Fumarate-FA (PRENATAL MULTIVITAMIN) TABS tablet Take 1 tablet by mouth daily at 12 noon.     No current facility-administered medications on file prior to visit.     PAST SURGICAL HISTORY Past Surgical History:  Procedure Laterality Date  . CESAREAN SECTION     x 2  . DILATATION & CURETTAGE/HYSTEROSCOPY WITH MYOSURE N/A 08/29/2018   Procedure: DILATATION & CURETTAGE/HYSTEROSCOPY WITH MYOSURE;  Surgeon: Waymon Amato, MD;  Location: [facility];  Service: Gynecology;  Laterality: N/A;    FAMILY HISTORY: No family history on file.  SOCIAL HISTORY:  reports that she has never smoked. She has never used smokeless tobacco. She reports that she does not drink alcohol or use drugs.  PERFORMANCE STATUS: The patient's performance status is 1 - Symptomatic but completely ambulatory  PHYSICAL EXAM: Most Recent Vital Signs: Blood pressure (!) 95/50, pulse 100, resp. rate 18, last menstrual period 08/28/2019, SpO2 96 %, unknown if currently breastfeeding. BP (!) 95/50 (BP Location: Left Arm, Patient Position: Sitting)   Pulse 100   Temp 97.7 F (36.5 C) (Temporal)   Resp 18   Wt  160 lb 6.4 oz (72.8 kg)   LMP 08/28/2019   SpO2 96%   BMI 24.39 kg/m   General Appearance:    Alert, cooperative, no distress, appears stated age  Head:    Normocephalic, without obvious abnormality, atraumatic  Eyes:    PERRL, conjunctiva/corneas clear, EOM's intact, fundi    benign, both eyes        Throat:   Lips, mucosa, and tongue normal; teeth and gums normal  Neck:   Supple, symmetrical, trachea midline, no adenopathy;    thyroid:  no enlargement/tenderness/nodules; no carotid   bruit or JVD  Back:     Symmetric, no curvature, ROM normal, no CVA tenderness  Lungs:     Clear to auscultation bilaterally, respirations unlabored  Chest Wall:    No tenderness or deformity   Heart:    Regular rate and rhythm, S1 and S2 normal, no murmur, rub   or gallop     Abdomen:     Soft, non-tender, bowel sounds active all four quadrants,    no masses, no organomegaly        Extremities:   Extremities normal, atraumatic, no cyanosis or edema  Pulses:   2+ and symmetric all extremities  Skin:   Skin color, texture, turgor normal, no rashes or lesions  Lymph nodes:   Cervical, supraclavicular, and axillary nodes normal  Neurologic:   CNII-XII intact, normal strength, sensation and reflexes    throughout    LABORATORY DATA:  Results for orders placed or performed in visit on 12/01/19 (from the past 48 hour(s))  CBC with Differential ([facility] Only)     Status: Abnormal   Collection Time: 12/01/19  1:20 PM  Result Value Ref Range   WBC Count 8.5 4.0 - 10.5 K/uL   RBC 4.89 3.87 - 5.11 MIL/uL   Hemoglobin 12.8 12.0 - 15.0 g/dL   HCT 38.2 36.0 - 46.0 %   MCV 78.1 (L) 80.0 - 100.0 fL   MCH 26.2 26.0 - 34.0 pg   MCHC 33.5 30.0 - 36.0 g/dL   RDW 14.4 11.5 - 15.5 %   Platelet Count 196 150 - 400 K/uL   nRBC 0.0 0.0 - 0.2 %   Neutrophils Relative % 61 %   Neutro Abs 5.3 1.7 - 7.7 K/uL   Lymphocytes Relative 25 %   Lymphs Abs 2.1 0.7 - 4.0 K/uL   Monocytes Relative 8 %   Monocytes  Absolute 0.6 0.1 - 1.0 K/uL   Eosinophils Relative 3 %   Eosinophils Absolute 0.3 0.0 - 0.5 K/uL   Basophils Relative 1 %   Basophils Absolute 0.0 0.0 - 0.1 K/uL   Immature Granulocytes 2 %   Abs Immature Granulocytes 0.17 (H) 0.00 - 0.07 K/uL    Comment: Performed at [facility] at [facility], [address] [place],  [address]  Save Smear [facility])  Status: None   Collection Time: 12/01/19  1:20 PM  Result Value Ref Range   Smear Review SMEAR STAINED AND AVAILABLE FOR REVIEW     Comment: Performed at [facility] at [facility], [address] [place], [place] [address]  Reticulocytes     Status: None   Collection Time: 12/01/19  1:22 PM  Result Value Ref Range   Retic Ct Pct 1.6 0.4 - 3.1 %   RBC. 4.92 3.87 - 5.11 MIL/uL   Retic Count, Absolute 79.2 19.0 - 186.0 K/uL   Immature Retic Fract 14.5 2.3 - 15.9 %    Comment: Performed at [facility] at [facility], [address] [place], [place] [address]      RADIOGRAPHY: No results found.     PATHOLOGY: None   ASSESSMENT/PLAN:  Ms. Statzer is a very pleasant 44 yo [place] female with recent history of DVT in the left brachial veins diagnosed on 10/30/2019.  Hyper coag panel is pending. We will see if there is any underlying reason for her to clot.  She will continue her same dose and regimen with Lovenox and will let us know when they have scheduled her C-section.  We will go ahead and plan to see her again in another 2 months and repeat an US of the left arm that same day.   All questions were answered and they are in agreement. The patient knows to call the clinic with any problems, questions or concerns. We can certainly see the patient much sooner if necessary.  The patient was discussed with and also seen by Dr. Marin Olp and he is in agreement with the aforementioned.   Laverna Peace   ADDENDUM: I saw and examined Ms. Krengel  with Judson Roch.  I agree with the above assessment.  It is unclear as whether or not she does have a hypercoagulable issue.  She has had a couple miscarriages.  It is unusual to see a thrombus in an upper extremity.  We will have to see what the hypercoagulable studies show.  This is her first thrombus.  It occurred during pregnancy.  As such, I do not believe that she will need lifelong anticoagulation.  She definitely will need therapeutic Lovenox throughout her pregnancy and for 6 weeks after delivery.  She is supposed to have her delivery at the end of October.  I am sure that with a date is set up, we will be able to just stop the Lovenox a day before delivery.  I do not think we have to switch her over to heparin.  She really has no other risk factors.  She was never on oral contraceptives prior to getting pregnant.  She does not smoke.  She is very nice.  I had a wonderful time talking to her and her husband.  They are originally from [place].  We will just have to see what the hypercoagulable panel shows.  I would like to do a Doppler of her left arm when we get her back so we can see how the thrombus is responding to the Lovenox.  She says that her left arm is feeling better.  It is not swollen and not as sore.  We spent about 45 minutes with Ms. Bellus and her husband.  We went over her lab work with her.  We answered all of their questions.  I would like to get her back in a couple months and again  do the Doppler of her left arm the same day that we see her.  Lattie Haw, MD

## 2019-12-02 LAB — PROTEIN S, TOTAL: Protein S Ag, Total: 94 % (ref 60–150)

## 2019-12-02 LAB — CARDIOLIPIN ANTIBODIES, IGG, IGM, IGA
Anticardiolipin IgA: <9 APL U/mL (ref 0–11)
Anticardiolipin IgG: <9 GPL U/mL (ref 0–14)
Anticardiolipin IgM: <9 MPL U/mL (ref 0–12)

## 2019-12-02 LAB — BETA-2-GLYCOPROTEIN I ABS, IGG/M/A
Beta-2 Glyco I IgG: <9 GPI IgG units (ref 0–20)
Beta-2-Glycoprotein I IgA: <9 GPI IgA units (ref 0–25)
Beta-2-Glycoprotein I IgM: <9 GPI IgM units (ref 0–32)

## 2019-12-02 LAB — LUPUS ANTICOAGULANT PANEL
DRVVT: 30.2 s (ref 0.0–47.0)
PTT Lupus Anticoagulant: 32.7 s (ref 0.0–51.9)

## 2019-12-02 LAB — PROTEIN C, TOTAL: Protein C, Total: 172 % — ABNORMAL HIGH (ref 60–150)

## 2019-12-02 LAB — IRON AND TIBC
Iron: 130 ug/dL (ref 41–142)
Saturation Ratios: 32 % (ref 21–57)
TIBC: 414 ug/dL (ref 236–444)
UIBC: 283 ug/dL (ref 120–384)

## 2019-12-02 LAB — PROTEIN C ACTIVITY: Protein C Activity: 159 % (ref 73–180)

## 2019-12-02 LAB — HOMOCYSTEINE: Homocysteine: 4.2 umol/L (ref 0.0–14.5)

## 2019-12-02 LAB — FERRITIN: Ferritin: 18 ng/mL (ref 11–307)

## 2019-12-02 LAB — PROTEIN S ACTIVITY: Protein S Activity: 42 % — ABNORMAL LOW (ref 63–140)

## 2019-12-03 DIAGNOSIS — Z86718 Personal history of other venous thrombosis and embolism: Secondary | ICD-10-CM | POA: Diagnosis not present

## 2019-12-03 LAB — HGB FRACTIONATION CASCADE
Hgb A2: 2.7 % (ref 1.8–3.2)
Hgb A: 97.3 % (ref 96.4–98.8)
Hgb F: 0 % (ref 0.0–2.0)
Hgb S: 0 %

## 2019-12-03 LAB — LDL CHOLESTEROL, DIRECT: Direct LDL: 150.2 mg/dL — ABNORMAL HIGH (ref 0–99)

## 2019-12-04 LAB — PROTHROMBIN GENE MUTATION

## 2019-12-04 LAB — FACTOR 5 LEIDEN

## 2019-12-07 ENCOUNTER — Other Ambulatory Visit: Payer: Self-pay | Admitting: Family

## 2019-12-07 ENCOUNTER — Telehealth: Payer: Self-pay | Admitting: Family

## 2019-12-07 DIAGNOSIS — D6859 Other primary thrombophilia: Secondary | ICD-10-CM

## 2019-12-07 NOTE — Telephone Encounter (Signed)
I was able to go over all her recent lab work with both she and her husband. She had low protein S activity. We will recheck at her next appointment along with a repeat US of her left arm to assess her response to Lovenox. No questions at this time.

## 2019-12-17 ENCOUNTER — Encounter: Payer: Self-pay | Admitting: Family

## 2019-12-18 ENCOUNTER — Other Ambulatory Visit: Payer: Self-pay | Admitting: Family

## 2019-12-18 ENCOUNTER — Ambulatory Visit (HOSPITAL_BASED_OUTPATIENT_CLINIC_OR_DEPARTMENT_OTHER)
Admission: RE | Admit: 2019-12-18 | Discharge: 2019-12-18 | Disposition: A | Discharge status: Home / Self Care | Payer: 59 | Source: Ambulatory Visit | Attending: Family | Admitting: Family

## 2019-12-18 ENCOUNTER — Ambulatory Visit (HOSPITAL_BASED_OUTPATIENT_CLINIC_OR_DEPARTMENT_OTHER): Payer: 59

## 2019-12-18 ENCOUNTER — Other Ambulatory Visit: Payer: Self-pay

## 2019-12-18 ENCOUNTER — Telehealth: Payer: Self-pay | Admitting: *Deleted

## 2019-12-18 DIAGNOSIS — D6859 Other primary thrombophilia: Secondary | ICD-10-CM

## 2019-12-18 DIAGNOSIS — I82622 Acute embolism and thrombosis of deep veins of left upper extremity: Secondary | ICD-10-CM

## 2019-12-18 NOTE — Telephone Encounter (Signed)
I called patient to inform her that the [place] of her arm and leg are negative for a DVT. I left this message on her cell phone. I instructed her to call the office if she had any questions or concerns.

## 2019-12-22 ENCOUNTER — Inpatient Hospital Stay (HOSPITAL_COMMUNITY)
Admission: AD | Admit: 2019-12-22 | Discharge: 2019-12-23 | Disposition: A | Discharge status: Home / Self Care | Payer: 59 | Attending: Obstetrics and Gynecology | Admitting: Obstetrics and Gynecology

## 2019-12-22 DIAGNOSIS — R110 Nausea: Secondary | ICD-10-CM | POA: Insufficient documentation

## 2019-12-22 DIAGNOSIS — O34219 Maternal care for unspecified type scar from previous cesarean delivery: Secondary | ICD-10-CM | POA: Insufficient documentation

## 2019-12-22 DIAGNOSIS — O26892 Other specified pregnancy related conditions, second trimester: Secondary | ICD-10-CM | POA: Insufficient documentation

## 2019-12-22 DIAGNOSIS — O09522 Supervision of elderly multigravida, second trimester: Secondary | ICD-10-CM | POA: Insufficient documentation

## 2019-12-22 DIAGNOSIS — O26899 Other specified pregnancy related conditions, unspecified trimester: Secondary | ICD-10-CM

## 2019-12-22 DIAGNOSIS — Z789 Other specified health status: Secondary | ICD-10-CM

## 2019-12-22 DIAGNOSIS — Z7901 Long term (current) use of anticoagulants: Secondary | ICD-10-CM | POA: Insufficient documentation

## 2019-12-22 DIAGNOSIS — R519 Headache, unspecified: Secondary | ICD-10-CM | POA: Insufficient documentation

## 2019-12-22 DIAGNOSIS — Z3A16 16 weeks gestation of pregnancy: Secondary | ICD-10-CM | POA: Insufficient documentation

## 2019-12-22 DIAGNOSIS — Z86718 Personal history of other venous thrombosis and embolism: Secondary | ICD-10-CM | POA: Insufficient documentation

## 2019-12-23 DIAGNOSIS — R1110 Vomiting, unspecified: Secondary | ICD-10-CM | POA: Diagnosis present

## 2019-12-23 DIAGNOSIS — Z7901 Long term (current) use of anticoagulants: Secondary | ICD-10-CM | POA: Diagnosis not present

## 2019-12-23 DIAGNOSIS — Z789 Other specified health status: Secondary | ICD-10-CM | POA: Diagnosis not present

## 2019-12-23 DIAGNOSIS — R519 Headache, unspecified: Secondary | ICD-10-CM

## 2019-12-23 DIAGNOSIS — Z3A16 16 weeks gestation of pregnancy: Secondary | ICD-10-CM | POA: Diagnosis not present

## 2019-12-23 DIAGNOSIS — O26892 Other specified pregnancy related conditions, second trimester: Secondary | ICD-10-CM

## 2019-12-23 DIAGNOSIS — O09522 Supervision of elderly multigravida, second trimester: Secondary | ICD-10-CM | POA: Diagnosis not present

## 2019-12-23 DIAGNOSIS — O34219 Maternal care for unspecified type scar from previous cesarean delivery: Secondary | ICD-10-CM | POA: Diagnosis not present

## 2019-12-23 DIAGNOSIS — R110 Nausea: Secondary | ICD-10-CM

## 2019-12-23 DIAGNOSIS — Z86718 Personal history of other venous thrombosis and embolism: Secondary | ICD-10-CM | POA: Diagnosis not present

## 2019-12-23 MED ORDER — ACETAMINOPHEN 325 MG PO TABS
650.0000 mg | ORAL_TABLET | Freq: Once | ORAL | Status: AC
  Administered 2019-12-23: 650 mg via ORAL
  Filled 2019-12-23: qty 2

## 2019-12-23 MED ORDER — ONDANSETRON 4 MG PO TBDP
4.0000 mg | ORAL_TABLET | Freq: Once | ORAL | Status: AC
  Administered 2019-12-23: 4 mg via ORAL
  Filled 2019-12-23: qty 1

## 2019-12-23 NOTE — Discharge Instructions (Signed)
General Headache Without Cause A headache is pain or discomfort felt around the head or neck area. The specific cause of a headache may not be found. There are many causes and types of headaches. A few common ones are:  Tension headaches.  Migraine headaches.  Cluster headaches.  Chronic daily headaches. Follow these instructions at home: Watch your condition for any changes. Let your health care provider know about them. Take these steps to help with your condition: Managing pain      Take over-the-counter and prescription medicines only as told by your health care provider.  Lie down in a dark, quiet room when you have a headache.  If directed, put ice on your head and neck area: ? Put ice in a plastic bag. ? Place a towel between your skin and the bag. ? Leave the ice on for 20 minutes, 2-3 times per day.  If directed, apply heat to the affected area. Use the heat source that your health care provider recommends, such as a moist heat pack or a heating pad. ? Place a towel between your skin and the heat source. ? Leave the heat on for 20-30 minutes. ? Remove the heat if your skin turns bright red. This is especially important if you are unable to feel pain, heat, or cold. You may have a greater risk of getting burned.  Keep lights dim if bright lights bother you or make your headaches worse. Eating and drinking  Eat meals on a regular schedule.  If you drink alcohol: ? Limit how much you use to:  0-1 drink a day for women.  0-2 drinks a day for men. ? Be aware of how much alcohol is in your drink. In the [place] one drink equals one 12 oz bottle of beer (355 mL), one 5 oz glass of wine (148 mL), or one 1 oz glass of hard liquor (44 mL).  Stop drinking caffeine, or decrease the amount of caffeine you drink. General instructions   Keep a headache journal to help find out what may trigger your headaches. For example, write down: ? What you eat and drink. ? How much  sleep you get. ? Any change to your diet or medicines.  Try massage or other relaxation techniques.  Limit stress.  Sit up straight, and do not tense your muscles.  Do not use any products that contain nicotine or tobacco, such as cigarettes, e-cigarettes, and chewing tobacco. If you need help quitting, ask your health care provider.  Exercise regularly as told by your health care provider.  Sleep on a regular schedule. Get 7-9 hours of sleep each night, or the amount recommended by your health care provider.  Keep all follow-up visits as told by your health care provider. This is important. Contact a health care provider if:  Your symptoms are not helped by medicine.  You have a headache that is different from the usual headache.  You have nausea or you vomit.  You have a fever. Get help right away if:  Your headache becomes severe quickly.  Your headache gets worse after moderate to intense physical activity.  You have repeated vomiting.  You have a stiff neck.  You have a loss of vision.  You have problems with speech.  You have pain in the eye or ear.  You have muscular weakness or loss of muscle control.  You lose your balance or have trouble walking.  You feel faint or pass out.  You have confusion.    You have a seizure. Summary  A headache is pain or discomfort felt around the head or neck area.  There are many causes and types of headaches. In some cases, the cause may not be found.  Keep a headache journal to help find out what may trigger your headaches. Watch your condition for any changes. Let your health care provider know about them.  Contact a health care provider if you have a headache that is different from the usual headache, or if your symptoms are not helped by medicine.  Get help right away if your headache becomes severe, you vomit, you have a loss of vision, you lose your balance, or you have a seizure. This information is not  intended to replace advice given to you by your health care provider. Make sure you discuss any questions you have with your health care provider. Document Revised: 01/20/2018 Document Reviewed: 01/20/2018 Elsevier Patient Education  [place].  Bacterial Vaginosis  Bacterial vaginosis is an infection of the vagina. It happens when too many normal germs (healthy bacteria) grow in the vagina. This infection puts you at risk for infections from sex (STIs). Treating this infection can lower your risk for some STIs. You should also treat this if you are pregnant. It can cause your baby to be born early. Follow these instructions at home: Medicines  Take over-the-counter and prescription medicines only as told by your doctor.  Take or use your antibiotic medicine as told by your doctor. Do not stop taking or using it even if you start to feel better. General instructions  If you your sexual partner is a woman, tell her that you have this infection. She needs to get treatment if she has symptoms. If you have a female partner, he does not need to be treated.  During treatment: ? Avoid sex. ? Do not douche. ? Avoid alcohol as told. ? Avoid breastfeeding as told.  Drink enough fluid to keep your pee (urine) clear or pale yellow.  Keep your vagina and butt (rectum) clean. ? Wash the area with warm water every day. ? Wipe from front to back after you use the toilet.  Keep all follow-up visits as told by your doctor. This is important. Preventing this condition  Do not douche.  Use only warm water to wash around your vagina.  Use protection when you have sex. This includes: ? Latex condoms. ? Dental dams.  Limit how many people you have sex with. It is best to only have sex with the same person (be monogamous).  Get tested for STIs. Have your partner get tested.  Wear underwear that is cotton or lined with cotton.  Avoid tight pants and pantyhose. This is most important in  summer.  Do not use any products that have nicotine or tobacco in them. These include cigarettes and e-cigarettes. If you need help quitting, ask your doctor.  Do not use illegal drugs.  Limit how much alcohol you drink. Contact a doctor if:  Your symptoms do not get better, even after you are treated.  You have more discharge or pain when you pee (urinate).  You have a fever.  You have pain in your belly (abdomen).  You have pain with sex.  Your bleed from your vagina between periods. Summary  This infection happens when too many germs (bacteria) grow in the vagina.  Treating this condition can lower your risk for some infections from sex (STIs).  You should also treat this if you  are pregnant. It can cause early (premature) birth.  Do not stop taking or using your antibiotic medicine even if you start to feel better. This information is not intended to replace advice given to you by your health care provider. Make sure you discuss any questions you have with your health care provider. Document Revised: 06/14/2017 Document Reviewed: 03/17/2016 Elsevier Patient Education  2020 [employer].

## 2019-12-23 NOTE — MAU Provider Note (Signed)
Chief Complaint:  Emesis   First Provider Initiated Contact with Patient 12/23/19 0054     HPI: Chelsey Hess is a 44 y.o. [id number] at 52w5dwho presents to maternity admissions reporting nausea and "feeling bad" since starting Metronidazole this morning for Bacterial Vaginosis.  States answering service told her to come here.  Did not speak to her provider. . She reports good fetal movement, denies LOF, vaginal bleeding, vaginal itching/burning, urinary symptoms, h/a, dizziness, diarrhea, constipation or fever/chills.  History remarkable for brachial thrombus (on Lovenox)  Emesis  This is a new problem. The current episode started today. The problem occurs less than 2 times per day. The problem has been resolved. There has been no fever. Associated symptoms include dizziness and headaches. Pertinent negatives include no abdominal pain, chills, diarrhea, fever or myalgias. She has tried nothing for the symptoms.    RN Note: PT SAYS SHE WENT TO OFFICE YESTERDAY - GAVE FLAGYL-  HAS TAKEN 2  DOSES.  FEELS - NAUSEA, H/A, DIZZY . CALLED OFFICE - ANSWERING SAYS CALL 911  Past Medical History: No past medical history on file.  Past obstetric history: OB History  Gravida Para Term Preterm AB Living  5 2 2   2 3   SAB TAB Ectopic Multiple Live Births  2     1 3     # Outcome Date GA Lbr Len/2nd Weight Sex Delivery Anes PTL Lv  5 Current           4 Term 06/10/03 [redacted]w[redacted]d   F CS-LTranv     3A Term 02/04/01 [redacted]w[redacted]d   M CS-LTranv   LIV  3B Term 02/04/01    M CS-LTranv   LIV  2 SAB           1 SAB             Past Surgical History: Past Surgical History:  Procedure Laterality Date  . CESAREAN SECTION     x 2  . DILATATION & CURETTAGE/HYSTEROSCOPY WITH MYOSURE N/A 08/29/2018   Procedure: DILATATION & CURETTAGE/HYSTEROSCOPY WITH MYOSURE;  Surgeon: Waymon Amato, MD;  Location: [facility];  Service: Gynecology;  Laterality: N/A;    Family History: No family history on  file.  Social History: Social History   Tobacco Use  . Smoking status: Never Smoker  . Smokeless tobacco: Never Used  Substance Use Topics  . Alcohol use: Never  . Drug use: Never    Allergies: No Known Allergies  Meds:  Medications Prior to Admission  Medication Sig Dispense Refill Last Dose  . acetaminophen (TYLENOL) 325 MG tablet Take 2 tablets (650 mg total) by mouth every 6 (six) hours as needed. 30 tablet 0   . enoxaparin (LOVENOX) 80 MG/0.8ML injection Inject 0.8 mLs (80 mg total) into the skin every 12 (twelve) hours. 48 mL 0   . Prenatal Vit-Fe Fumarate-FA (PRENATAL MULTIVITAMIN) TABS tablet Take 1 tablet by mouth daily at 12 noon.       I have reviewed patient's Past Medical Hx, Surgical Hx, Family Hx, Social Hx, medications and allergies.   ROS:  Review of Systems  Constitutional: Negative for chills and fever.  Gastrointestinal: Positive for vomiting. Negative for abdominal pain and diarrhea.  Musculoskeletal: Negative for myalgias.  Neurological: Positive for dizziness and headaches.   Other systems negative  Physical Exam   Patient Vitals for the past 24 hrs:  BP Temp Temp src Pulse Resp  12/23/19 0023 (!) 113/55 (!) 97.4 F (36.3 C) Oral 78  20   Constitutional: Well-developed, well-nourished female in no acute distress.  Cardiovascular: normal rate and rhythm Respiratory: normal effort, clear to auscultation bilaterally GI: Abd soft, non-tender, gravid appropriate for gestational age.   No rebound or guarding. MS: Extremities nontender, no edema, normal ROM Neurologic: Alert and oriented x 4.  GU: Neg CVAT.  PELVIC EXAM:  deferred  FHT:  150   Labs: No results found for this or any previous visit (from the past 24 hour(s)). --/--/B POS (11/20 3338)  Imaging:    MAU Course/MDM: Discussed intolerance to Flagyl is not unusual Discussed there are other choices for treatment of BV, advised her to call office for their preference of other  treatments .  Treatments in MAU included Tylenol for headache and Zofran for nausea..    Assessment: Single IUP at [redacted]w[redacted]d Medication intolerance (Flagyl) Nausea Headache  Plan: Discharge home Has rx for zofran at home Push PO fluids  Follow up in Office for prenatal visits and plan of care for treating BV Encouraged to return here or to other Urgent Care/ED if she develops worsening of symptoms, increase in pain, fever, or other concerning symptoms.  Pt stable at time of discharge.  Hansel Feinstein CNM, MSN Certified Nurse-Midwife 12/23/2019 12:54 AM

## 2019-12-23 NOTE — MAU Note (Signed)
PT SAYS SHE WENT TO OFFICE YESTERDAY - GAVE FLAGYL-  HAS TAKEN 2  DOSES.  FEELS - NAUSEA, H/A, DIZZY . CALLED OFFICE - ANSWERING SAYS CALL 911.

## 2020-01-27 ENCOUNTER — Other Ambulatory Visit: Payer: Self-pay | Admitting: *Deleted

## 2020-01-27 NOTE — Patient Outreach (Signed)
[place] [facility]) Care Management  01/27/2020  Chelsey Hess [birth year] [id number]  Telephone Screen-Successful-Decline  RN spoke with pt's today and explained [facility] services and the purpose for today's call. Pt receptive and explains some of her issues pending a special ultrasound soon to further assist her with her ongoing issues. RN discussed [place] and how to assist with managing her care to low her risk for ED visits or hospital readmissions. Pt appreciative but decline any assistance at this time. RN further explained available assistance with social workers and pharmacy needs however pt again declined any [facility] services at this time. Offered contact numbers for [facility] if services maybe needed in the future.  Plan: Will alert pt's provider of pt's decline for [facility] services at this time and close this case.  Raina Mina, RN Care Management Coordinator [place] Office [phone]

## 2020-01-28 ENCOUNTER — Other Ambulatory Visit: Payer: Self-pay | Admitting: Obstetrics and Gynecology

## 2020-01-28 DIAGNOSIS — Z369 Encounter for antenatal screening, unspecified: Secondary | ICD-10-CM

## 2020-02-01 ENCOUNTER — Encounter: Payer: Self-pay | Admitting: Family

## 2020-02-01 ENCOUNTER — Inpatient Hospital Stay (HOSPITAL_BASED_OUTPATIENT_CLINIC_OR_DEPARTMENT_OTHER): Payer: 59 | Admitting: Family

## 2020-02-01 ENCOUNTER — Other Ambulatory Visit: Payer: Self-pay

## 2020-02-01 ENCOUNTER — Inpatient Hospital Stay: Payer: 59 | Attending: Family

## 2020-02-01 VITALS — BP 121/70 | HR 86 | Temp 98.2°F | Resp 18 | Ht 68.0 in | Wt 167.0 lb

## 2020-02-01 DIAGNOSIS — I82622 Acute embolism and thrombosis of deep veins of left upper extremity: Secondary | ICD-10-CM

## 2020-02-01 DIAGNOSIS — Z7901 Long term (current) use of anticoagulants: Secondary | ICD-10-CM | POA: Diagnosis not present

## 2020-02-01 DIAGNOSIS — D6859 Other primary thrombophilia: Secondary | ICD-10-CM | POA: Diagnosis not present

## 2020-02-01 DIAGNOSIS — Z86718 Personal history of other venous thrombosis and embolism: Secondary | ICD-10-CM | POA: Diagnosis present

## 2020-02-01 NOTE — Progress Notes (Signed)
Hematology and Oncology Follow Up Visit  Chelsey Hess [id number] [birth year] 44 y.o. 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 She really feels bad after lab draws so we drew her Protein S studies today and her CBC and CMP next week.  She still feels fatigued and has some palpitations and mild SOB with over exertion. She rests as needed.  She also notes a back ache off and on.  She is scheduled to have a C-section on 05/16/2020.  She is doing well on Lovenox and verbalized that she is taking as prescribed.  She has not noted any abnormal blood loss. No petechiae. She does bruise easily but not in excess.  No fever, chills, n/v, cough, rash, dizziness, SOB, chest pain, palpitations, abdominal pain or changes in bowel or bladder habits.  No swelling, tenderness, numbness or tingling in her extremities at this time.  No falls or syncopal episodes to report.  She is eating well and staying properly hydrated. Her weight is stable.   ECOG Performance Status: 1 - Symptomatic but completely ambulatory  Medications:  Allergies as of 02/01/2020   No Known Allergies     Medication List       Accurate as of February 01, 2020 12:42 PM. If you have any questions, ask your nurse or doctor.        STOP taking these medications   acetaminophen 325 MG tablet Commonly known as: TYLENOL Stopped by: Laverna Peace, NP     TAKE these medications   enoxaparin 80 MG/0.8ML injection Commonly known as: Lovenox Inject 0.8 mLs (80 mg total) into the skin every 12 (twelve) hours.   prenatal multivitamin Tabs tablet Take 1 tablet by mouth daily at 12 noon.       Allergies: No Known Allergies  Past Medical History, Surgical history, Social history, and Family History were reviewed  and updated.  Review of Systems: All other 10 point review of systems is negative.   Physical Exam:  height is 5\' 8"  (1.727 m) and weight is 167 lb (75.8 kg). Her oral temperature is 98.2 F (36.8 C). Her blood pressure is 121/70 and her pulse is 86. Her respiration is 18 and oxygen saturation is 97%.   Wt Readings from Last 3 Encounters:  02/01/20 167 lb (75.8 kg)  12/01/19 160 lb 6.4 oz (72.8 kg)  11/03/19 161 lb 4.8 oz (73.2 kg)    Ocular: Sclerae unicteric, pupils equal, round and reactive to light Ear-nose-throat: Oropharynx clear, dentition fair Lymphatic: No cervical or supraclavicular adenopathy Lungs no rales or rhonchi, good excursion bilaterally Heart regular rate and rhythm, no murmur appreciated Abd soft, nontender, positive bowel sounds, no liver or spleen tip palpated on exam, no fluid wave  MSK no focal spinal tenderness, no joint edema Neuro: non-focal, well-oriented, appropriate affect Breasts: Deferred   Lab Results  Component Value Date   WBC 8.5 12/01/2019   HGB 12.8 12/01/2019   HCT 38.2 12/01/2019   MCV 78.1 (L) 12/01/2019   PLT 196 12/01/2019   Lab Results  Component Value Date   FERRITIN 18 12/01/2019   IRON 130 12/01/2019   TIBC 414 12/01/2019   UIBC 283 12/01/2019   IRONPCTSAT 32 12/01/2019   Lab Results  Component Value Date  RETICCTPCT 1.6 12/01/2019   RBC 4.92 12/01/2019   No results found for: KPAFRELGTCHN, LAMBDASER, KAPLAMBRATIO No results found for: IGGSERUM, IGA, IGMSERUM No results found for: Ronnald Ramp, A1GS, A2GS, Violet Baldy, MSPIKE, SPEI   Chemistry      Component Value Date/Time   NA 136 12/01/2019 1320   K 3.5 12/01/2019 1320   CL 103 12/01/2019 1320   CO2 22 12/01/2019 1320   BUN 12 12/01/2019 1320   CREATININE 0.90 12/01/2019 1320      Component Value Date/Time   CALCIUM 9.3 12/01/2019 1320   ALKPHOS 33 (L) 12/01/2019 1320   AST 10 (L) 12/01/2019 1320   ALT 12 12/01/2019 1320   BILITOT  0.2 (L) 12/01/2019 1320       Impression and Plan: Chelsey Hess is a very pleasant 44 yo [place] female with history of 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  She is tolerating Lovenox BID well and will continue her same regimen.  She is scheduled for C-section on 05/16/2020.  We will plan to see her again the first week of October for follow-up and make sure she is ready for delivery. Lab draw with be 2 tubes at visit and 2 tubes the following week per her request.  We will see her again next week to get her CBC and CMP.  They are in agreement with the plan and will contact our office with any questions or concerns. We can certainly see her sooner if needed.   Laverna Peace, NP 7/19/202112:42 PM

## 2020-02-02 LAB — PROTEIN S, TOTAL: Protein S Ag, Total: 80 % (ref 60–150)

## 2020-02-02 LAB — PROTEIN S ACTIVITY: Protein S Activity: 43 % — ABNORMAL LOW (ref 63–140)

## 2020-02-04 ENCOUNTER — Ambulatory Visit: Payer: Self-pay | Admitting: Genetic Counselor

## 2020-02-04 ENCOUNTER — Ambulatory Visit: Payer: 59 | Admitting: *Deleted

## 2020-02-04 ENCOUNTER — Ambulatory Visit: Payer: 59 | Attending: Obstetrics and Gynecology

## 2020-02-04 ENCOUNTER — Other Ambulatory Visit: Payer: Self-pay

## 2020-02-04 ENCOUNTER — Other Ambulatory Visit: Payer: Self-pay | Admitting: *Deleted

## 2020-02-04 VITALS — BP 114/59 | HR 81

## 2020-02-04 DIAGNOSIS — O283 Abnormal ultrasonic finding on antenatal screening of mother: Secondary | ICD-10-CM

## 2020-02-04 DIAGNOSIS — O3412 Maternal care for benign tumor of corpus uteri, second trimester: Secondary | ICD-10-CM

## 2020-02-04 DIAGNOSIS — O0990 Supervision of high risk pregnancy, unspecified, unspecified trimester: Secondary | ICD-10-CM

## 2020-02-04 DIAGNOSIS — D259 Leiomyoma of uterus, unspecified: Secondary | ICD-10-CM

## 2020-02-04 DIAGNOSIS — Z363 Encounter for antenatal screening for malformations: Secondary | ICD-10-CM | POA: Diagnosis not present

## 2020-02-04 DIAGNOSIS — O35BXX0 Maternal care for other (suspected) fetal abnormality and damage, fetal cardiac anomalies, not applicable or unspecified: Secondary | ICD-10-CM

## 2020-02-04 DIAGNOSIS — Z369 Encounter for antenatal screening, unspecified: Secondary | ICD-10-CM | POA: Insufficient documentation

## 2020-02-04 DIAGNOSIS — O09522 Supervision of elderly multigravida, second trimester: Secondary | ICD-10-CM

## 2020-02-04 DIAGNOSIS — O34219 Maternal care for unspecified type scar from previous cesarean delivery: Secondary | ICD-10-CM | POA: Diagnosis not present

## 2020-02-04 DIAGNOSIS — Z3A22 22 weeks gestation of pregnancy: Secondary | ICD-10-CM

## 2020-02-04 DIAGNOSIS — O2692 Pregnancy related conditions, unspecified, second trimester: Secondary | ICD-10-CM

## 2020-02-04 DIAGNOSIS — Z362 Encounter for other antenatal screening follow-up: Secondary | ICD-10-CM

## 2020-02-04 NOTE — Progress Notes (Addendum)
I briefly met with [name] and her partner at Dr. Fritz Pickerel request to discuss findings from their ultrasound. 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 We discussed that transposition of the great arteries is a type of congenital heart defect (CHD) that causes oxygen-poor blood to be circulated to the body and oxygen-rich blood to be circulated between the lungs and heart, rather than to the body. We discussed that surgery is performed after birth to correct this CHD to ensure that the baby gets enough oxygenated blood pumping through its body.   I counseled the couple that transposition of the great arteries is not a characteristic feature of many genetic conditions. Instead, it often occurs due to a combination of environmental and genetic factors during the prenatal development period that are often difficult to identify. Regardless, we discussed genetic screening that [name] has already had done and screening/testing options that are available if the couple is interested in trying to get more information about the etiology of this CHD.  We first reviewed that [name] had [facility] noninvasive prenatal screening (NIPS) through the laboratory [facility] that was low-risk for fetal aneuploidies. We reviewed that these results showed a less than 1 in 10,000 risk for trisomies 21, 18 and 13, and monosomy X (Turner syndrome). In addition, the risk for triploidy and sex chromosome trisomies (47,XXX and 47,XXY) was also low. [name] elected to have cfDNA analysis for 22q11.2 deletion syndrome, which was also low risk (1 in 9000). We reviewed that this testing is not diagnostic; however, these negative results do reduce the chances of the fetus having one of these conditions, some of which (Turner syndrome, 22q11.2 deletion syndrome) can have transposition of the great arteries as a feature.   We also discussed that they may consider undergoing diagnostic testing via  amniocentesis. They were counseled that amniocentesis is the only option to definitively determine whether or not the fetus has a chromosomal or other genetic condition in the prenatal period. We discussed the technical aspects of the procedure and quoted up to a 1 in 500 (0.2%) risk for spontaneous pregnancy loss or other adverse pregnancy outcomes as a result of amniocentesis. Cultured cells from an amniocentesis sample allow for the visualization of a fetal karyotype, which can detect >99% of chromosomal aberrations. Chromosomal microarray can also be performed to identify smaller deletions or duplications of fetal chromosomal material. Amniocentesis could also be performed to assess whether the baby is affected by another condition that can be associated with transposition of the great arteries. We also reviewed that the couple has the option of waiting until the postnatal period to pursue genetic testing if clinically indicated. Finally, I informed the couple that a fetal echocardiogram is recommended to get a better understanding of the fetus's CHD. This will be especially helpful in informing their delivery and treatment plan. I also told them that it would likely be recommended that she delivery at an institution such as [facility] or [facility] given their capacity to care for infants with CHDs.  The couple was understandably overwhelmed with the information presented today. [name] told me that she is not interested in pursuing diagnostic testing at this time. She and her partner did not have many questions given that this information came so unexpectedly. Dr. Annamaria Boots and I made a plan with the couple to check in with them following their fetal echocardiogram to determine if they have any additional questions at that time. They were agreeable to this plan.  Buelah Manis, MS, [facility] Genetic Counselor

## 2020-02-08 ENCOUNTER — Other Ambulatory Visit: Payer: Self-pay

## 2020-02-08 ENCOUNTER — Inpatient Hospital Stay: Payer: 59

## 2020-02-08 DIAGNOSIS — D6859 Other primary thrombophilia: Secondary | ICD-10-CM

## 2020-02-08 DIAGNOSIS — Z86718 Personal history of other venous thrombosis and embolism: Secondary | ICD-10-CM | POA: Diagnosis not present

## 2020-02-08 DIAGNOSIS — I82622 Acute embolism and thrombosis of deep veins of left upper extremity: Secondary | ICD-10-CM

## 2020-02-08 LAB — CBC WITH DIFFERENTIAL (CANCER CENTER ONLY)
Abs Immature Granulocytes: 0.28 10*3/uL — ABNORMAL HIGH (ref 0.00–0.07)
Basophils Absolute: 0.1 10*3/uL (ref 0.0–0.1)
Basophils Relative: 1 %
Eosinophils Absolute: 0.3 10*3/uL (ref 0.0–0.5)
Eosinophils Relative: 3 %
HCT: 37 % (ref 36.0–46.0)
Hemoglobin: 12.4 g/dL (ref 12.0–15.0)
Immature Granulocytes: 3 %
Lymphocytes Relative: 22 %
Lymphs Abs: 1.9 10*3/uL (ref 0.7–4.0)
MCH: 28.6 pg (ref 26.0–34.0)
MCHC: 33.5 g/dL (ref 30.0–36.0)
MCV: 85.5 fL (ref 80.0–100.0)
Monocytes Absolute: 0.5 10*3/uL (ref 0.1–1.0)
Monocytes Relative: 6 %
Neutro Abs: 5.7 10*3/uL (ref 1.7–7.7)
Neutrophils Relative %: 65 %
Platelet Count: 156 10*3/uL (ref 150–400)
RBC: 4.33 MIL/uL (ref 3.87–5.11)
RDW: 14.4 % (ref 11.5–15.5)
WBC Count: 8.7 10*3/uL (ref 4.0–10.5)
nRBC: 0 % (ref 0.0–0.2)

## 2020-02-08 LAB — CMP (CANCER CENTER ONLY)
ALT: 18 U/L (ref 0–44)
AST: 13 U/L — ABNORMAL LOW (ref 15–41)
Albumin: 4.1 g/dL (ref 3.5–5.0)
Alkaline Phosphatase: 36 U/L — ABNORMAL LOW (ref 38–126)
Anion gap: 10 (ref 5–15)
BUN: 9 mg/dL (ref 6–20)
CO2: 25 mmol/L (ref 22–32)
Calcium: 9.8 mg/dL (ref 8.9–10.3)
Chloride: 105 mmol/L (ref 98–111)
Creatinine: 0.57 mg/dL (ref 0.44–1.00)
GFR, Est AFR Am: >60 mL/min (ref >60)
GFR, Estimated: >60 mL/min (ref >60)
Glucose, Bld: 175 mg/dL — ABNORMAL HIGH (ref 70–99)
Potassium: 3.9 mmol/L (ref 3.5–5.1)
Sodium: 140 mmol/L (ref 135–145)
Total Bilirubin: 0.3 mg/dL (ref 0.3–1.2)
Total Protein: 6.5 g/dL (ref 6.5–8.1)

## 2020-02-19 DIAGNOSIS — O35BXX0 Maternal care for other (suspected) fetal abnormality and damage, fetal cardiac anomalies, not applicable or unspecified: Secondary | ICD-10-CM | POA: Diagnosis present

## 2020-02-19 DIAGNOSIS — O358XX0 Maternal care for other (suspected) fetal abnormality and damage, not applicable or unspecified: Secondary | ICD-10-CM | POA: Diagnosis present

## 2020-02-26 DIAGNOSIS — I82409 Acute embolism and thrombosis of unspecified deep veins of unspecified lower extremity: Secondary | ICD-10-CM | POA: Diagnosis present

## 2020-02-26 DIAGNOSIS — Z98891 History of uterine scar from previous surgery: Secondary | ICD-10-CM

## 2020-02-26 DIAGNOSIS — O3429 Maternal care due to uterine scar from other previous surgery: Secondary | ICD-10-CM | POA: Diagnosis present

## 2020-03-01 ENCOUNTER — Ambulatory Visit: Payer: 59

## 2020-03-01 DIAGNOSIS — O0992 Supervision of high risk pregnancy, unspecified, second trimester: Secondary | ICD-10-CM

## 2020-03-12 ENCOUNTER — Inpatient Hospital Stay (HOSPITAL_COMMUNITY): Payer: 59

## 2020-03-12 ENCOUNTER — Encounter (HOSPITAL_COMMUNITY): Payer: Self-pay | Admitting: Family Medicine

## 2020-03-12 ENCOUNTER — Inpatient Hospital Stay (HOSPITAL_COMMUNITY)
Admission: AD | Admit: 2020-03-12 | Discharge: 2020-03-13 | Disposition: A | Discharge status: Home / Self Care | Payer: 59 | Source: Home / Self Care | Attending: Obstetrics and Gynecology | Admitting: Obstetrics and Gynecology

## 2020-03-12 ENCOUNTER — Other Ambulatory Visit: Payer: Self-pay

## 2020-03-12 DIAGNOSIS — I82409 Acute embolism and thrombosis of unspecified deep veins of unspecified lower extremity: Secondary | ICD-10-CM | POA: Diagnosis present

## 2020-03-12 DIAGNOSIS — O3429 Maternal care due to uterine scar from other previous surgery: Secondary | ICD-10-CM | POA: Diagnosis present

## 2020-03-12 DIAGNOSIS — D259 Leiomyoma of uterus, unspecified: Secondary | ICD-10-CM | POA: Diagnosis present

## 2020-03-12 DIAGNOSIS — O358XX0 Maternal care for other (suspected) fetal abnormality and damage, not applicable or unspecified: Secondary | ICD-10-CM | POA: Diagnosis present

## 2020-03-12 DIAGNOSIS — O0992 Supervision of high risk pregnancy, unspecified, second trimester: Secondary | ICD-10-CM

## 2020-03-12 DIAGNOSIS — O35BXX0 Maternal care for other (suspected) fetal abnormality and damage, fetal cardiac anomalies, not applicable or unspecified: Secondary | ICD-10-CM | POA: Diagnosis present

## 2020-03-12 DIAGNOSIS — O364XX0 Maternal care for intrauterine death, not applicable or unspecified: Secondary | ICD-10-CM

## 2020-03-12 DIAGNOSIS — Z98891 History of uterine scar from previous surgery: Secondary | ICD-10-CM

## 2020-03-12 DIAGNOSIS — O368190 Decreased fetal movements, unspecified trimester, not applicable or unspecified: Secondary | ICD-10-CM

## 2020-03-12 LAB — CBC
HCT: 39.3 % (ref 36.0–46.0)
Hemoglobin: 13.2 g/dL (ref 12.0–15.0)
MCH: 28.6 pg (ref 26.0–34.0)
MCHC: 33.6 g/dL (ref 30.0–36.0)
MCV: 85.1 fL (ref 80.0–100.0)
Platelets: 161 10*3/uL (ref 150–400)
RBC: 4.62 MIL/uL (ref 3.87–5.11)
RDW: 13.8 % (ref 11.5–15.5)
WBC: 10.5 10*3/uL (ref 4.0–10.5)
nRBC: 0 % (ref 0.0–0.2)

## 2020-03-12 NOTE — MAU Provider Note (Signed)
History     Patient Active Problem List   Diagnosis Date Noted  . Uterine leiomyoma 03/12/2020  . Supervision of high risk pregnancy in second trimester 03/01/2020  . Deep venous thrombosis ([place]) 02/26/2020  . History of 2 cesarean sections 02/26/2020  . Pregnancy with history of uterine myomectomy 02/26/2020  . Pregnancy complicated by fetal cardiovascular abnormality, not applicable or unspecified fetus 02/19/2020    Chief Complaint  Patient presents with  . Decreased Fetal Movement   Patient informally scanned by A. Goskins, MD and without FHT.  Provider to bedside after formal US reveals absent cardiac activity.  Patient and husband appropriately upset and with relevant questions.  Patient reports that she has not felt movement since earlier today, but felt strong movements yesterday. Patient states that she takes Lovenox for a h/o DVT and took her dosage today.  Patient expresses concern with delivering today, via C/S, due to dosing.  Patient informed that since infant is cephalic, vaginal delivery would be appropriate under this situation.  Patient verbalizes understanding, but goes on to report feeling overwhelmed and wanting to go home.  Comfort and reassurance given and patient informed that going home to discuss outcome with her children and get ready would is understandable.  Patient cautioned that not returning to hospital could result in her getting sick, septic, or even death of patient as well. Patient reports that she would come back in the morning.  Patient goes on to report that she is under the care of Dr. Wilhelmenia Blase and request that she be notified.  Patient initially admitted under [facility] attending.     OB History    Gravida  5   Para  2   Term  2   Preterm      AB  2   Living  3     SAB  2   TAB      Ectopic      Multiple  1   Live Births  3           Past Medical History:  Diagnosis Date  . DVT (deep vein thrombosis) in pregnancy   . Protein S  deficiency [facility])     Past Surgical History:  Procedure Laterality Date  . CESAREAN SECTION     x 2  . DILATATION & CURETTAGE/HYSTEROSCOPY WITH MYOSURE N/A 08/29/2018   Procedure: DILATATION & CURETTAGE/HYSTEROSCOPY WITH MYOSURE;  Surgeon: Waymon Amato, MD;  Location: [place];  Service: Gynecology;  Laterality: N/A;    No family history on file.  Social History   Tobacco Use  . Smoking status: Never Smoker  . Smokeless tobacco: Never Used  Vaping Use  . Vaping Use: Never used  Substance Use Topics  . Alcohol use: Never  . Drug use: Never    Allergies: No Known Allergies  Medications Prior to Admission  Medication Sig Dispense Refill Last Dose  . enoxaparin (LOVENOX) 80 MG/0.8ML injection Inject 0.8 mLs (80 mg total) into the skin every 12 (twelve) hours. 48 mL 0   . Prenatal Vit-Fe Fumarate-FA (PRENATAL MULTIVITAMIN) TABS tablet Take 1 tablet by mouth daily at 12 noon.       ROS  See HPI Above Physical Exam   Blood pressure 126/85, pulse (!) 101, temperature 97.6 F (36.4 C), temperature source Oral, resp. rate 20, height 5\' 8"  (1.727 m), weight 76.3 kg, last menstrual period 08/28/2019, SpO2 99 %, unknown if currently breastfeeding.  No results found for this or any previous  visit (from the past 24 hour(s)).  Physical Exam Vitals reviewed.  Constitutional:      Appearance: Normal appearance.  HENT:     Head: Normocephalic and atraumatic.  Eyes:     Conjunctiva/sclera: Conjunctivae normal.  Cardiovascular:     Rate and Rhythm: Normal rate.  Pulmonary:     Effort: Pulmonary effort is normal.  Neurological:     Mental Status: She is alert and oriented to person, place, and time.  Psychiatric:        Behavior: Behavior normal.        Thought Content: Thought content normal.        Judgment: Judgment normal.     FHR: Absent ED Course  Assessment: 44 year old G5P2023 at 28.1 weeks IUFD  Plan: -Dr. Charolette Child consulted and reports that she  will come in to speak with patient. -Provider to bedside to update patient on POC.  -Dr. Charolette Child en route.   [place], MSN 03/12/2020 10:43 PM   Reassessment (11:01 PM) Dr. Charolette Child on unit and assumes care.  Maryann Conners MSN, CNM Advanced Practice Provider, [facility] for [employer]

## 2020-03-12 NOTE — MAU Note (Signed)
..  Chelsey Hess is a 44 y.o. at [redacted]w[redacted]d here in MAU reporting: No fetal movement since 1100 today. Pt denies vaginal bleeding or LOF. No pain.   FHT: Monitors applied. Nothing auscultated. Provider notified and at bedside with bedside US.

## 2020-03-13 ENCOUNTER — Other Ambulatory Visit: Payer: Self-pay

## 2020-03-13 ENCOUNTER — Encounter (HOSPITAL_COMMUNITY): Payer: Self-pay | Admitting: Obstetrics and Gynecology

## 2020-03-13 ENCOUNTER — Inpatient Hospital Stay (HOSPITAL_COMMUNITY)
Admission: RE | Admit: 2020-03-13 | Discharge: 2020-03-13 | DRG: 807 | Disposition: A | Discharge status: Home / Self Care | Payer: 59 | Attending: Obstetrics and Gynecology | Admitting: Obstetrics and Gynecology

## 2020-03-13 ENCOUNTER — Inpatient Hospital Stay (HOSPITAL_COMMUNITY)
Admission: AD | Admit: 2020-03-13 | Discharge: 2020-03-15 | Disposition: A | Discharge status: Home / Self Care | Payer: 59 | Source: Home / Self Care | Attending: Obstetrics and Gynecology | Admitting: Obstetrics and Gynecology

## 2020-03-13 DIAGNOSIS — O3413 Maternal care for benign tumor of corpus uteri, third trimester: Secondary | ICD-10-CM | POA: Diagnosis present

## 2020-03-13 DIAGNOSIS — O358XX0 Maternal care for other (suspected) fetal abnormality and damage, not applicable or unspecified: Secondary | ICD-10-CM | POA: Diagnosis present

## 2020-03-13 DIAGNOSIS — O364XX0 Maternal care for intrauterine death, not applicable or unspecified: Secondary | ICD-10-CM | POA: Diagnosis present

## 2020-03-13 DIAGNOSIS — O34211 Maternal care for low transverse scar from previous cesarean delivery: Secondary | ICD-10-CM | POA: Diagnosis present

## 2020-03-13 DIAGNOSIS — Z86718 Personal history of other venous thrombosis and embolism: Secondary | ICD-10-CM

## 2020-03-13 DIAGNOSIS — Z8759 Personal history of other complications of pregnancy, childbirth and the puerperium: Secondary | ICD-10-CM

## 2020-03-13 DIAGNOSIS — Z20822 Contact with and (suspected) exposure to covid-19: Secondary | ICD-10-CM | POA: Diagnosis present

## 2020-03-13 DIAGNOSIS — D259 Leiomyoma of uterus, unspecified: Secondary | ICD-10-CM | POA: Diagnosis present

## 2020-03-13 DIAGNOSIS — O34219 Maternal care for unspecified type scar from previous cesarean delivery: Secondary | ICD-10-CM | POA: Diagnosis present

## 2020-03-13 DIAGNOSIS — Z3A28 28 weeks gestation of pregnancy: Secondary | ICD-10-CM

## 2020-03-13 HISTORY — DX: Maternal care for intrauterine death, not applicable or unspecified: O36.4XX0

## 2020-03-13 HISTORY — DX: Personal history of other complications of pregnancy, childbirth and the puerperium: Z87.59

## 2020-03-13 LAB — CBC
HCT: 38.5 % (ref 36.0–46.0)
Hemoglobin: 12.9 g/dL (ref 12.0–15.0)
MCH: 28.4 pg (ref 26.0–34.0)
MCHC: 33.5 g/dL (ref 30.0–36.0)
MCV: 84.6 fL (ref 80.0–100.0)
Platelets: 146 10*3/uL — ABNORMAL LOW (ref 150–400)
RBC: 4.55 MIL/uL (ref 3.87–5.11)
RDW: 13.9 % (ref 11.5–15.5)
WBC: 10.2 10*3/uL (ref 4.0–10.5)
nRBC: 0 % (ref 0.0–0.2)

## 2020-03-13 LAB — BASIC METABOLIC PANEL
Anion gap: 11 (ref 5–15)
BUN: 8 mg/dL (ref 6–20)
CO2: 22 mmol/L (ref 22–32)
Calcium: 10 mg/dL (ref 8.9–10.3)
Chloride: 105 mmol/L (ref 98–111)
Creatinine, Ser: 0.51 mg/dL (ref 0.44–1.00)
GFR calc Af Amer: >60 mL/min (ref >60)
GFR calc non Af Amer: >60 mL/min (ref >60)
Glucose, Bld: 120 mg/dL — ABNORMAL HIGH (ref 70–99)
Potassium: 4.1 mmol/L (ref 3.5–5.1)
Sodium: 138 mmol/L (ref 135–145)

## 2020-03-13 LAB — DIC (DISSEMINATED INTRAVASCULAR COAGULATION)PANEL
D-Dimer, Quant: 0.97 ug/mL-FEU — ABNORMAL HIGH (ref 0.00–0.50)
Fibrinogen: 525 mg/dL — ABNORMAL HIGH (ref 210–475)
INR: 1 (ref 0.8–1.2)
Platelets: 168 10*3/uL (ref 150–400)
Prothrombin Time: 13 seconds (ref 11.4–15.2)
Smear Review: NONE SEEN
aPTT: 31 seconds (ref 24–36)

## 2020-03-13 LAB — TYPE AND SCREEN
ABO/RH(D): B POS
Antibody Screen: NEGATIVE

## 2020-03-13 LAB — SARS CORONAVIRUS 2 BY RT PCR (HOSPITAL ORDER, PERFORMED IN ~~LOC~~ HOSPITAL LAB): SARS Coronavirus 2: NEGATIVE

## 2020-03-13 MED ORDER — OXYCODONE-ACETAMINOPHEN 5-325 MG PO TABS: 1.0000 | ORAL_TABLET | ORAL | Status: DC | PRN

## 2020-03-13 MED ORDER — BUTORPHANOL TARTRATE 1 MG/ML IJ SOLN
1.0000 mg | INTRAMUSCULAR | Status: DC | PRN
  Administered 2020-03-14 (×4): 1 mg via INTRAVENOUS
  Filled 2020-03-13 (×4): qty 1

## 2020-03-13 MED ORDER — OXYTOCIN-SODIUM CHLORIDE 30-0.9 UT/500ML-% IV SOLN
2.5000 [IU]/h | INTRAVENOUS | Status: DC
  Administered 2020-03-14: 2.5 [IU]/h via INTRAVENOUS
  Filled 2020-03-13: qty 500

## 2020-03-13 MED ORDER — LACTATED RINGERS IV SOLN: INTRAVENOUS | Status: DC

## 2020-03-13 MED ORDER — LACTATED RINGERS IV SOLN: 500.0000 mL | INTRAVENOUS | Status: DC | PRN

## 2020-03-13 MED ORDER — ACETAMINOPHEN 325 MG PO TABS: 650.0000 mg | ORAL_TABLET | ORAL | Status: DC | PRN

## 2020-03-13 MED ORDER — SOD CITRATE-CITRIC ACID 500-334 MG/5ML PO SOLN: 30.0000 mL | ORAL | Status: DC | PRN

## 2020-03-13 MED ORDER — MISOPROSTOL 200 MCG PO TABS
200.0000 ug | ORAL_TABLET | Freq: Two times a day (BID) | ORAL | Status: DC | PRN
  Administered 2020-03-14: 200 ug via VAGINAL
  Filled 2020-03-13: qty 1

## 2020-03-13 MED ORDER — ONDANSETRON HCL 4 MG/2ML IJ SOLN: 4.0000 mg | Freq: Four times a day (QID) | INTRAMUSCULAR | Status: DC | PRN

## 2020-03-13 MED ORDER — MISOPROSTOL 200 MCG PO TABS: 200.0000 ug | ORAL_TABLET | ORAL | Status: DC | PRN

## 2020-03-13 MED ORDER — MISOPROSTOL 200 MCG PO TABS
200.0000 ug | ORAL_TABLET | Freq: Two times a day (BID) | ORAL | Status: DC | PRN
  Administered 2020-03-13 – 2020-03-14 (×2): 200 ug via ORAL
  Filled 2020-03-13 (×2): qty 1

## 2020-03-13 MED ORDER — OXYCODONE-ACETAMINOPHEN 5-325 MG PO TABS: 2.0000 | ORAL_TABLET | ORAL | Status: DC | PRN

## 2020-03-13 MED ORDER — LIDOCAINE HCL (PF) 1 % IJ SOLN: 30.0000 mL | INTRAMUSCULAR | Status: DC | PRN

## 2020-03-13 MED ORDER — OXYTOCIN BOLUS FROM INFUSION
333.0000 mL | Freq: Once | INTRAVENOUS | Status: AC
  Administered 2020-03-14: 333 mL via INTRAVENOUS

## 2020-03-13 NOTE — Progress Notes (Signed)
Pt discharged home with significant other. Discharge instructions given and discussed. All questions answered. Pt and significant other verbalized understanding.

## 2020-03-13 NOTE — H&P (Signed)
  Pt presents to L&D to discuss options for 28wk IUFD with know TGA AFVSS  Please see MAU notes from last pm.  Pt will return this pm to start induction Have d/w pt at length r/b/a of cytotec IOL vs hysterotomy

## 2020-03-13 NOTE — H&P (Signed)
Chelsey Hess is a 44 y.o. female [phone] at 28+ wk with IUFD,  Fetus w known Transposition of Great Vessels - has been seen by [facility], also AMA.  Pt has h/o LTCS x 2; fibroids with several myomectomies.  EDC by [facility] from IUI.  Pt also w brachial clot in 4/21 on treatment dose Lovenox.  Has held doses for IOL vs hysterotomy.    D/W pt possible routes of delivery.  IOL w cytotec 244mcg q 6 hr vs hysterotomy.  D/w pt r/b/a  [facility] home after diagnosis, Normal CBC and DIC panel (normal for pregnancy)   OB History    Gravida  5   Para  2   Term  2   Preterm      AB  2   Living  3     SAB  2   TAB      Ectopic      Multiple  1   Live Births  3         SAB x 2 LTCS for twin males, LTCS female G5 present - TGA  No abn pap No STD  Past Medical History:  Diagnosis Date  . DVT (deep vein thrombosis) in pregnancy   . Protein S deficiency [facility])    Past Surgical History:  Procedure Laterality Date  . CESAREAN SECTION     x 2  . DILATATION & CURETTAGE/HYSTEROSCOPY WITH MYOSURE N/A 08/29/2018   Procedure: DILATATION & CURETTAGE/HYSTEROSCOPY WITH MYOSURE;  Surgeon: Waymon Amato, MD;  Location: [place];  Service: Gynecology;  Laterality: N/A;   Family History: pt denies family history of disease Social History:  reports that she has never smoked. She has never used smokeless tobacco. She reports that she does not drink alcohol and does not use drugs.married  Meds Lovenox, PNV All NKDA     Review of Systems  Constitutional: Negative.   HENT: Negative.   Respiratory: Negative.   Cardiovascular: Negative.   Gastrointestinal: Negative.   Genitourinary: Negative.   Musculoskeletal: Negative.   Skin: Negative.   Neurological: Negative.   Psychiatric/Behavioral: Negative.    Maternal Medical History:  Reason for admission: IUFD   Contractions: Frequency: irregular.    Prenatal complications: Transposition of Great Vessels Brachial DVT 4/21 -  treatment dose Uterine fibroids - s/p myomectomies  Pregnancy from IUI  Prenatal Complications - Diabetes: none.      Blood pressure 126/85, pulse (!) 101, temperature 97.6 F (36.4 C), temperature source Oral, resp. rate 20, height 5\' 8"  (1.727 m), weight 76.3 kg, last menstrual period 08/28/2019, SpO2 99 %, unknown if currently breastfeeding. Maternal Exam:  Abdomen: Patient reports no abdominal tenderness. Surgical scars: low transverse.   Fundal height is appropriate for gestation.   Fetal presentation: vertex  Introitus: Normal vulva. Normal vagina.    Physical Exam Constitutional:      Appearance: Normal appearance.  Cardiovascular:     Rate and Rhythm: Normal rate and regular rhythm.  Pulmonary:     Effort: Pulmonary effort is normal.     Breath sounds: Normal breath sounds.  Abdominal:     General: Bowel sounds are normal.     Palpations: Abdomen is soft.     Comments: gravid  Genitourinary:    General: Normal vulva.  Musculoskeletal:        General: Normal range of motion.     Cervical back: Normal range of motion and neck supple.  Skin:    General: Skin is warm and  dry.  Neurological:     General: No focal deficit present.     Mental Status: She is alert and oriented to person, place, and time.  Psychiatric:        Mood and Affect: Mood normal.        Behavior: Behavior normal.     Prenatal labs: ABO, Rh: --/--/B POS (11/20 0841) Antibody:  neg Rubella:  immune RPR:   NR HBsAg:   neg HIV:   neg GBS:   unknown  Dated by [facility] 06/03/20  Hgb 13.6/Plt 262/Ur Cx neg/ GC neg/Chl neg/Varicella nonimmune/Hgb electro WNL  Nl anat x limited heart and face F/u continued limited heart MFM - TGV  Assessment/Plan: 44yo [id number] at 28+ with IUFD (also fetal TGV, brachial DVT) Admit for delivery D/w pt r/b/a of routes of delivery - vaginal vs hysterotomy   Kirk Sampley Bovard-Stuckert 03/13/2020, 12:53 AM

## 2020-03-13 NOTE — H&P (Deleted)
  The note originally documented on this encounter has been moved the the encounter in which it belongs.  

## 2020-03-13 NOTE — H&P (Signed)
Chelsey Hess is a 44 y.o. female [phone] at 28+ wk with IUFD,  Fetus w known Transposition of Great Vessels - has been seen by [facility], also AMA.  Pt has h/o LTCS x 2; fibroids with several myomectomies.  EDC by [facility] from IUI.  Pt also w brachial clot in 4/21 on treatment dose Lovenox.  Has held doses for IOL vs hysterotomy.    D/W pt possible routes of delivery.  IOL w cytotec 249mcg q 6 hr vs hysterotomy.  D/w pt r/b/a  [facility] home after diagnosis, Normal CBC and DIC panel (normal for pregnancy).  Presented AM 8/29 only 12 hours after last dose - unable to offer epidural/spinal until 24 hours after last dose.  Represented closer to 24 hr mark.  As cervix unfavorable, will start with cytotec 222mcg alternate oral and pv, when able will place foley bulb.  Pt desires induction of labor for vaginal delivery   OB History    Gravida  5   Para  2   Term  2   Preterm      AB  2   Living  3     SAB  2   TAB      Ectopic      Multiple  1   Live Births  3         SAB x 2 LTCS for twin males, LTCS female G5 present - TGA  No abn pap No STD  Past Medical History:  Diagnosis Date  . DVT (deep vein thrombosis) in pregnancy   . IUFD at 65 weeks or more of gestation 03/13/2020  . Protein S deficiency [facility])    Past Surgical History:  Procedure Laterality Date  . CESAREAN SECTION     x 2  . DILATATION & CURETTAGE/HYSTEROSCOPY WITH MYOSURE N/A 08/29/2018   Procedure: DILATATION & CURETTAGE/HYSTEROSCOPY WITH MYOSURE;  Surgeon: Waymon Amato, MD;  Location: [place];  Service: Gynecology;  Laterality: N/A;   Family History: pt denies family history of disease Social History:  reports that she has never smoked. She has never used smokeless tobacco. She reports that she does not drink alcohol and does not use drugs.married  Meds Lovenox, PNV All NKDA     Review of Systems  Constitutional: Negative.   HENT: Negative.   Respiratory: Negative.   Cardiovascular:  Negative.   Gastrointestinal: Negative.   Genitourinary: Negative.   Musculoskeletal: Negative.   Skin: Negative.   Neurological: Negative.   Psychiatric/Behavioral: Negative.    Maternal Medical History:  Reason for admission: IUFD   Contractions: Frequency: irregular.    Prenatal complications: Transposition of Great Vessels Brachial DVT 4/21 - treatment dose Uterine fibroids - s/p myomectomies  Pregnancy from IUI  Prenatal Complications - Diabetes: none.      Last menstrual period 08/28/2019, unknown if currently breastfeeding. Maternal Exam:  Abdomen: Patient reports no abdominal tenderness. Surgical scars: low transverse.   Fundal height is appropriate for gestation.   Fetal presentation: vertex  Introitus: Normal vulva. Normal vagina.    Physical Exam Constitutional:      Appearance: Normal appearance.  Cardiovascular:     Rate and Rhythm: Normal rate and regular rhythm.  Pulmonary:     Effort: Pulmonary effort is normal.     Breath sounds: Normal breath sounds.  Abdominal:     General: Bowel sounds are normal.     Palpations: Abdomen is soft.     Comments: gravid  Genitourinary:    General:  Normal vulva.  Musculoskeletal:        General: Normal range of motion.     Cervical back: Normal range of motion and neck supple.  Skin:    General: Skin is warm and dry.  Neurological:     General: No focal deficit present.     Mental Status: She is alert and oriented to person, place, and time.  Psychiatric:        Mood and Affect: Mood normal.        Behavior: Behavior normal.     Prenatal labs: ABO, Rh: --/--/B POS (08/28 2335) Antibody: NEG (08/28 2335)neg Rubella:  immune RPR:   NR HBsAg:   neg HIV:   neg GBS:   unknown  Dated by [facility] 06/03/20  Hgb 13.6/Plt 262/Ur Cx neg/ GC neg/Chl neg/Varicella nonimmune/Hgb electro WNL  Nl anat x limited heart and face F/u continued limited heart MFM - TGV  Assessment/Plan: 44yo [id number] at 28+ with IUFD  (also fetal TGV, brachial DVT) Admit for delivery D/w pt r/b/a of routes of delivery - vaginal vs hysterotomy   Epidural for pain at pt request. cytotec and FB for IOL  Avaneesh Pepitone Bovard-Stuckert 03/13/2020, 6:09 PM

## 2020-03-13 NOTE — Progress Notes (Signed)
Patient ID: Chelsey Hess, female   DOB: 03-Oct-1975, 44 y.o.   MRN: [id number]  Pt opts to try for vaginal delivery SVE closed/th/high, poaterior  Wants to go home, will call MD at 5pm to be sure bed is available. D/W pt cytotec and foley bulb for IOL  Nursing and anesthesia aware

## 2020-03-13 NOTE — Progress Notes (Signed)
Patient ID: Chelsey Hess, female   DOB: 1976/05/13, 44 y.o.   MRN: [id number]  Pt presents to L&D with known IUFD.  Again d/w pt and FOB options of hysterotomy vs vaginal delivery, inc r/b/a.  D/w pt last lovenox dose at 8pm 8/28; Unable to offer epidural/spinal until after 8pm.   Can do other methods of pain control until epidural, (PCA or IV pain medicine)   Vaginal delivery induction takes longer time, but hysterotomy is harder to recover from.

## 2020-03-14 ENCOUNTER — Inpatient Hospital Stay (HOSPITAL_COMMUNITY): Payer: 59 | Admitting: Anesthesiology

## 2020-03-14 ENCOUNTER — Encounter (HOSPITAL_COMMUNITY): Payer: Self-pay | Admitting: Obstetrics and Gynecology

## 2020-03-14 LAB — CBC
HCT: 37.9 % (ref 36.0–46.0)
Hemoglobin: 12.8 g/dL (ref 12.0–15.0)
MCH: 29 pg (ref 26.0–34.0)
MCHC: 33.8 g/dL (ref 30.0–36.0)
MCV: 85.7 fL (ref 80.0–100.0)
Platelets: 145 K/uL — ABNORMAL LOW (ref 150–400)
RBC: 4.42 MIL/uL (ref 3.87–5.11)
RDW: 13.7 % (ref 11.5–15.5)
WBC: 13.1 K/uL — ABNORMAL HIGH (ref 4.0–10.5)
nRBC: 0 % (ref 0.0–0.2)

## 2020-03-14 LAB — RPR: RPR Ser Ql: NONREACTIVE

## 2020-03-14 MED ORDER — PHENYLEPHRINE 40 MCG/ML (10ML) SYRINGE FOR IV PUSH (FOR BLOOD PRESSURE SUPPORT): 80.0000 ug | PREFILLED_SYRINGE | INTRAVENOUS | Status: DC | PRN

## 2020-03-14 MED ORDER — DIBUCAINE (PERIANAL) 1 % EX OINT: 1.0000 "application " | TOPICAL_OINTMENT | CUTANEOUS | Status: DC | PRN

## 2020-03-14 MED ORDER — FENTANYL-BUPIVACAINE-NACL 0.5-0.125-0.9 MG/250ML-% EP SOLN
EPIDURAL | Status: AC
  Filled 2020-03-14: qty 250

## 2020-03-14 MED ORDER — TETANUS-DIPHTH-ACELL PERTUSSIS 5-2.5-18.5 LF-MCG/0.5 IM SUSP: 0.5000 mL | Freq: Once | INTRAMUSCULAR | Status: DC

## 2020-03-14 MED ORDER — ZOLPIDEM TARTRATE 5 MG PO TABS: 5.0000 mg | ORAL_TABLET | Freq: Every evening | ORAL | Status: DC | PRN

## 2020-03-14 MED ORDER — EPHEDRINE 5 MG/ML INJ: 10.0000 mg | INTRAVENOUS | Status: DC | PRN

## 2020-03-14 MED ORDER — ENOXAPARIN SODIUM 80 MG/0.8ML ~~LOC~~ SOLN: 80.0000 mg | SUBCUTANEOUS | Status: DC

## 2020-03-14 MED ORDER — ONDANSETRON HCL 4 MG PO TABS: 4.0000 mg | ORAL_TABLET | ORAL | Status: DC | PRN

## 2020-03-14 MED ORDER — LACTATED RINGERS IV SOLN: 500.0000 mL | Freq: Once | INTRAVENOUS | Status: DC

## 2020-03-14 MED ORDER — DIPHENHYDRAMINE HCL 25 MG PO CAPS: 25.0000 mg | ORAL_CAPSULE | Freq: Four times a day (QID) | ORAL | Status: DC | PRN

## 2020-03-14 MED ORDER — BENZOCAINE-MENTHOL 20-0.5 % EX AERO: 1.0000 "application " | INHALATION_SPRAY | CUTANEOUS | Status: DC | PRN

## 2020-03-14 MED ORDER — SENNOSIDES-DOCUSATE SODIUM 8.6-50 MG PO TABS
2.0000 | ORAL_TABLET | ORAL | Status: DC
  Administered 2020-03-14: 2 via ORAL
  Filled 2020-03-14: qty 2

## 2020-03-14 MED ORDER — FENTANYL-BUPIVACAINE-NACL 0.5-0.125-0.9 MG/250ML-% EP SOLN: 12.0000 mL/h | EPIDURAL | Status: DC | PRN

## 2020-03-14 MED ORDER — COCONUT OIL OIL: 1.0000 "application " | TOPICAL_OIL | Status: DC | PRN

## 2020-03-14 MED ORDER — IBUPROFEN 600 MG PO TABS
600.0000 mg | ORAL_TABLET | Freq: Four times a day (QID) | ORAL | Status: DC
  Administered 2020-03-14 – 2020-03-15 (×3): 600 mg via ORAL
  Filled 2020-03-14 (×3): qty 1

## 2020-03-14 MED ORDER — LIDOCAINE HCL (PF) 1 % IJ SOLN
INTRAMUSCULAR | Status: DC | PRN
  Administered 2020-03-14: 8 mL via EPIDURAL

## 2020-03-14 MED ORDER — SODIUM CHLORIDE (PF) 0.9 % IJ SOLN
INTRAMUSCULAR | Status: DC | PRN
Start: 2020-03-14 — End: 2020-03-14
  Administered 2020-03-14: 12 mL/h via EPIDURAL

## 2020-03-14 MED ORDER — WITCH HAZEL-GLYCERIN EX PADS: 1.0000 "application " | MEDICATED_PAD | CUTANEOUS | Status: DC | PRN

## 2020-03-14 MED ORDER — SIMETHICONE 80 MG PO CHEW: 80.0000 mg | CHEWABLE_TABLET | ORAL | Status: DC | PRN

## 2020-03-14 MED ORDER — DIPHENHYDRAMINE HCL 50 MG/ML IJ SOLN: 12.5000 mg | INTRAMUSCULAR | Status: DC | PRN

## 2020-03-14 MED ORDER — CEFAZOLIN SODIUM-DEXTROSE 2-4 GM/100ML-% IV SOLN
2.0000 g | Freq: Once | INTRAVENOUS | Status: AC
  Administered 2020-03-14: 2 g via INTRAVENOUS
  Filled 2020-03-14: qty 100

## 2020-03-14 MED ORDER — PRENATAL MULTIVITAMIN CH: 1.0000 | ORAL_TABLET | Freq: Every day | ORAL | Status: DC

## 2020-03-14 MED ORDER — ONDANSETRON HCL 4 MG/2ML IJ SOLN: 4.0000 mg | INTRAMUSCULAR | Status: DC | PRN

## 2020-03-14 MED ORDER — ENOXAPARIN SODIUM 80 MG/0.8ML ~~LOC~~ SOLN: 80.0000 mg | Freq: Two times a day (BID) | SUBCUTANEOUS | Status: DC

## 2020-03-14 MED ORDER — ACETAMINOPHEN 325 MG PO TABS: 650.0000 mg | ORAL_TABLET | ORAL | Status: DC | PRN

## 2020-03-14 NOTE — Progress Notes (Addendum)
Patient ID: Chelsey Hess, female   DOB: 02/18/1976, 44 y.o.   MRN: [id number]  No c/o's - recently received stadol for discomfort, having small bleeding  AFVSS gen NAD toco runs of q 2 min  cytotec #2 placed vaginally SVE cl/50/-2, posteror  D/W pt course of induction; when able will place foley bulb.   Reiterated can take a very long period of time.  275mcg of cytotec vaginally

## 2020-03-14 NOTE — Anesthesia Procedure Notes (Signed)
Epidural Patient location during procedure: OB Start time: 03/14/2020 10:27 AM End time: 03/14/2020 10:36 AM  Staffing Anesthesiologist: Merlinda Frederick, MD Performed: anesthesiologist   Preanesthetic Checklist Completed: patient identified, IV checked, risks and benefits discussed, monitors and equipment checked, pre-op evaluation and timeout performed  Epidural Patient position: sitting Prep: DuraPrep Patient monitoring: heart rate, continuous pulse ox and blood pressure Approach: midline Location: L3-L4 Injection technique: LOR air  Needle:  Needle type: Tuohy  Needle gauge: 17 G Needle length: 9 cm Needle insertion depth: 4 cm Catheter type: closed end flexible Catheter size: 19 Gauge Catheter at skin depth: 9 cm  Assessment Events: blood not aspirated, injection not painful, no injection resistance, no paresthesia and negative IV test  Additional Notes Reason for block:procedure for pain

## 2020-03-14 NOTE — Progress Notes (Signed)
Comfortable after epidural. TOCO q2-52m after cytotec only. BB, AROM 1335 meconium stained, rapid descent of fetal occiput. Now complete with pelvic pressure, move towards delivery

## 2020-03-14 NOTE — Progress Notes (Signed)
S/p cytotec at 0730. Last CE 0/50/-1 @ 0940. Just decided on epidural. TOCO w/ ctx q2-3. Will reassess after epidural placement

## 2020-03-14 NOTE — Progress Notes (Signed)
Spent time with Christine and her husband throughout the day today to introduce spiritual care services and offer support as they prepared to deliver their daughter, Clent Ridges, who they learned no longer has a heart beat.  The couple shared that they lost another baby, who was conceived by IUI, early in pregnancy and this baby was a natural conception, but they wonder if this might be their last opportunity to have a baby together.  (The couple has 4 children who are late teens and young adults from previous marriages.)  During our time together, the couple verbalized their feelings of grief, asked questions about what to expect for delivery and how to plan for burial, and begin to move forward after their loss.    I educated about grief and the stages it comes in and the balance of joy of meetings a child after a period of expectation even though it's also full of grief.    Followed up with family to process feelings after birth and seeing their daughter for the first time.  FOB shared some anxiety about having to call the funeral home to arrange pick up and cremation.  I supported him as he discerned how to manage that conversation and gave space to share the birth story.  Spent time with staff as they prepared Raya's body, took photos, and dressed her.  Will continue to follow in the period prior to discharge.  Please page as further needs arise.  Donald Prose. Elyn Peers, M.Div. [facility] Chaplain Pager [phone] Office [phone]      03/14/20 1643  Clinical Encounter Type  Visited With Patient and family together  Visit Type Psychological support;Spiritual support  Spiritual Encounters  Spiritual Needs Emotional;Grief support

## 2020-03-14 NOTE — Anesthesia Preprocedure Evaluation (Signed)
Anesthesia Evaluation  Patient identified by MRN, date of birth, ID band  Reviewed: Allergy & Precautions, NPO status , Patient's Chart, lab work & pertinent test results  Airway Mallampati: I  TM Distance: >3 FB Neck ROM: Full    Dental no notable dental hx.    Pulmonary neg pulmonary ROS,    Pulmonary exam normal breath sounds clear to auscultation       Cardiovascular Exercise Tolerance: Good + DVT  Normal cardiovascular exam Rhythm:Regular Rate:Normal     Neuro/Psych negative neurological ROS  negative psych ROS   GI/Hepatic negative GI ROS, Neg liver ROS,   Endo/Other  negative endocrine ROS  Renal/GU negative Renal ROS     Musculoskeletal negative musculoskeletal ROS (+)   Abdominal Normal abdominal exam  (+)   Peds negative pediatric ROS (+)  Hematology Protein S deficiency on Lovenox. Last does 8/28 pm   Anesthesia Other Findings   Reproductive/Obstetrics (+) Pregnancy (IUFD)                             Anesthesia Physical Anesthesia Plan  ASA: II  Anesthesia Plan: Epidural   Post-op Pain Management:    Induction:   PONV Risk Score and Plan:   Airway Management Planned:   Additional Equipment:   Intra-op Plan:   Post-operative Plan:   Informed Consent: I have reviewed the patients History and Physical, chart, labs and discussed the procedure including the risks, benefits and alternatives for the proposed anesthesia with the patient or authorized representative who has indicated his/her understanding and acceptance.       Plan Discussed with:   Anesthesia Plan Comments:         Anesthesia Quick Evaluation

## 2020-03-15 LAB — CBC
HCT: 34.4 % — ABNORMAL LOW (ref 36.0–46.0)
Hemoglobin: 11.2 g/dL — ABNORMAL LOW (ref 12.0–15.0)
MCH: 28.2 pg (ref 26.0–34.0)
MCHC: 32.6 g/dL (ref 30.0–36.0)
MCV: 86.6 fL (ref 80.0–100.0)
Platelets: 134 10*3/uL — ABNORMAL LOW (ref 150–400)
RBC: 3.97 MIL/uL (ref 3.87–5.11)
RDW: 13.6 % (ref 11.5–15.5)
WBC: 11.7 10*3/uL — ABNORMAL HIGH (ref 4.0–10.5)
nRBC: 0 % (ref 0.0–0.2)

## 2020-03-15 MED ORDER — IBUPROFEN 600 MG PO TABS: 600.0000 mg | ORAL_TABLET | Freq: Four times a day (QID) | ORAL | 1 refills | Status: DC | PRN

## 2020-03-15 NOTE — Progress Notes (Signed)
Discharge instructions and prescriptions given to pt. Discussed post vaginal delivery care, signs and symptoms to report to the MD, upcoming appointments, and meds. Pt verbalizes understanding and has no questions at this time. Pt discharged from hospital in stable condition.

## 2020-03-15 NOTE — Discharge Instructions (Signed)
Postpartum Care After Vaginal Delivery This sheet gives you information about how to care for yourself from the time you deliver your baby to up to 6-12 weeks after delivery (postpartum period). Your health care provider may also give you more specific instructions. If you have problems or questions, contact your health care provider. Follow these instructions at home: Vaginal bleeding  It is normal to have vaginal bleeding (lochia) after delivery. Wear a sanitary pad for vaginal bleeding and discharge. ? During the first week after delivery, the amount and appearance of lochia is often similar to a menstrual period. ? Over the next few weeks, it will gradually decrease to a dry, yellow-brown discharge. ? For most women, lochia stops completely by 4-6 weeks after delivery. Vaginal bleeding can vary from woman to woman.  Change your sanitary pads frequently. Watch for any changes in your flow, such as: ? A sudden increase in volume. ? A change in color. ? Large blood clots.  If you pass a blood clot from your vagina, save it and call your health care provider to discuss. Do not flush blood clots down the toilet before talking with your health care provider.  Do not use tampons or douches until your health care provider says this is safe.  If you are not breastfeeding, your period should return 6-8 weeks after delivery. If you are feeding your child breast milk only (exclusive breastfeeding), your period may not return until you stop breastfeeding. Perineal care  Keep the area between the vagina and the anus (perineum) clean and dry as told by your health care provider. Use medicated pads and pain-relieving sprays and creams as directed.  If you had a cut in the perineum (episiotomy) or a tear in the vagina, check the area for signs of infection until you are healed. Check for: ? More redness, swelling, or pain. ? Fluid or blood coming from the cut or tear. ? Warmth. ? Pus or a bad  smell.  You may be given a squirt bottle to use instead of wiping to clean the perineum area after you go to the bathroom. As you start healing, you may use the squirt bottle before wiping yourself. Make sure to wipe gently.  To relieve pain caused by an episiotomy, a tear in the vagina, or swollen veins in the anus (hemorrhoids), try taking a warm sitz bath 2-3 times a day. A sitz bath is a warm water bath that is taken while you are sitting down. The water should only come up to your hips and should cover your buttocks. Breast care  Within the first few days after delivery, your breasts may feel heavy, full, and uncomfortable (breast engorgement). Milk may also leak from your breasts. Your health care provider can suggest ways to help relieve the discomfort. Breast engorgement should go away within a few days.  If you are breastfeeding: ? Wear a bra that supports your breasts and fits you well. ? Keep your nipples clean and dry. Apply creams and ointments as told by your health care provider. ? You may need to use breast pads to absorb milk that leaks from your breasts. ? You may have uterine contractions every time you breastfeed for up to several weeks after delivery. Uterine contractions help your uterus return to its normal size. ? If you have any problems with breastfeeding, work with your health care provider or Science writer.  If you are not breastfeeding: ? Avoid touching your breasts a lot. Doing this can make  your breasts produce more milk. ? Wear a good-fitting bra and use cold packs to help with swelling. ? Do not squeeze out (express) milk. This causes you to make more milk. Intimacy and sexuality  Ask your health care provider when you can engage in sexual activity. This may depend on: ? Your risk of infection. ? How fast you are healing. ? Your comfort and desire to engage in sexual activity.  You are able to get pregnant after delivery, even if you have not had  your period. If desired, talk with your health care provider about methods of birth control (contraception). Medicines  Take over-the-counter and prescription medicines only as told by your health care provider.  If you were prescribed an antibiotic medicine, take it as told by your health care provider. Do not stop taking the antibiotic even if you start to feel better. Activity  Gradually return to your normal activities as told by your health care provider. Ask your health care provider what activities are safe for you.  Rest as much as possible. Try to rest or take a nap while your baby is sleeping. Eating and drinking   Drink enough fluid to keep your urine pale yellow.  Eat high-fiber foods every day. These may help prevent or relieve constipation. High-fiber foods include: ? Whole grain cereals and breads. ? Brown rice. ? Beans. ? Fresh fruits and vegetables.  Do not try to lose weight quickly by cutting back on calories.  Take your prenatal vitamins until your postpartum checkup or until your health care provider tells you it is okay to stop. Lifestyle  Do not use any products that contain nicotine or tobacco, such as cigarettes and e-cigarettes. If you need help quitting, ask your health care provider.  Do not drink alcohol, especially if you are breastfeeding. General instructions  Keep all follow-up visits for you and your baby as told by your health care provider. Most women visit their health care provider for a postpartum checkup within the first 3-6 weeks after delivery. Contact a health care provider if:  You feel unable to cope with the changes that your child brings to your life, and these feelings do not go away.  You feel unusually sad or worried.  Your breasts become red, painful, or hard.  You have a fever.  You have trouble holding urine or keeping urine from leaking.  You have little or no interest in activities you used to enjoy.  You have not  breastfed at all and you have not had a menstrual period for 12 weeks after delivery.  You have stopped breastfeeding and you have not had a menstrual period for 12 weeks after you stopped breastfeeding.  You have questions about caring for yourself or your baby.  You pass a blood clot from your vagina. Get help right away if:  You have chest pain.  You have difficulty breathing.  You have sudden, severe leg pain.  You have severe pain or cramping in your lower abdomen.  You bleed from your vagina so much that you fill more than one sanitary pad in one hour. Bleeding should not be heavier than your heaviest period.  You develop a severe headache.  You faint.  You have blurred vision or spots in your vision.  You have bad-smelling vaginal discharge.  You have thoughts about hurting yourself or your baby. If you ever feel like you may hurt yourself or others, or have thoughts about taking your own life, get help  right away. You can go to the nearest emergency department or call:  Your local emergency services (911 in the [place]  A suicide crisis helpline, such as the [facility] at 703-234-3245. This is open 24 hours a day. Summary  The period of time right after you deliver your newborn up to 6-12 weeks after delivery is called the postpartum period.  Gradually return to your normal activities as told by your health care provider.  Keep all follow-up visits for you and your baby as told by your health care provider. This information is not intended to replace advice given to you by your health care provider. Make sure you discuss any questions you have with your health care provider. Document Revised: 07/05/2017 Document Reviewed: 04/15/2017 Elsevier Patient Education  2020 [employer]. Call office with any concerns [phone]

## 2020-03-15 NOTE — Anesthesia Postprocedure Evaluation (Signed)
Anesthesia Post Note  Patient: Chelsey Hess  Procedure(s) Performed: AN AD [place]     Patient location during evaluation: Mother Baby Anesthesia Type: Epidural Level of consciousness: awake and alert and oriented Pain management: satisfactory to patient Vital Signs Assessment: post-procedure vital signs reviewed and stable Respiratory status: respiratory function stable Cardiovascular status: stable Postop Assessment: no headache, no backache, epidural receding, patient able to bend at knees, no signs of nausea or vomiting, adequate PO intake and able to ambulate Anesthetic complications: no   No complications documented.  Last Vitals:  Vitals:   03/14/20 2234 03/15/20 0406  BP: (!) 101/50 (!) 110/57  Pulse: 78 78  Resp: 17 18  Temp: 36.9 C 36.7 C  SpO2: 99% 99%    Last Pain:  Vitals:   03/15/20 0532  TempSrc:   PainSc: 0-No pain   Pain Goal: Patients Stated Pain Goal: 6 (03/14/20 0850)                 Katherina Mires

## 2020-03-15 NOTE — Progress Notes (Signed)
Patient ID: Chelsey Hess, female   DOB: [birth year], 44 y.o.   MRN: [id number] Pt reports coping well. Still having moments of uncontrolled bouts of crying as expected. Husband present and very supportive. They have somewhat of a good support system at home but actual family [place]. She reports pain well controlled and lochia mild. She does have a concern about resuming lovenox - had moderate watery bleeding this am for the first time at injection site. Worried about resuming bid dosing.  VSS GEN - NAD, appropriate ABD - FF and below umbilicus EXT - no Homans  11.7>11.2<134  A/P: PPD#1 s/p vaginal delivery of fetus with demise         Reviewed postpartum expectations; have met with representative with options for counseling reviewed.         Recommend daily lovenox today; skip dose tonight         Will call pt in am to check on bleeding ( office assistant will calL) then advice further         Discharge instructions reviewed - rec f/u in 3-4 weeks

## 2020-03-15 NOTE — Progress Notes (Signed)
  Follow up visit.  Freyja shared that she's been tearful on and off throughout the day. I normalized her response and the grief she will continue to feel.  She showed me some maternity photos and we discussed some ways the family might memorialize Raya in the days and weeks ahead.    The family requested my assistance with contacting [facility] stating that it was just "too much" for them to make the call themselves.  I helped them to clarify their needs and facilitated the phone call to confirm that they could go to the funeral home after being discharged, pay $50 and sign the paperwork to have Lorenzo cremated.  Also confirmed that they are willing to help them move the cremains into a receptacle they purchase on their own or purchase from them.    I provided some resources for grief support including information on our upcoming grief support group.  The family is aware our services and support continue to be available to them after discharge and knows how to get in touch with the chaplain.  Please page as further needs arise.  Donald Prose. Elyn Peers, M.Div. [facility] Chaplain Pager [phone] Office [phone]    03/15/20 1100  Clinical Encounter Type  Visited With Patient and family together  Visit Type Psychological support;Spiritual support  Spiritual Encounters  Spiritual Needs Emotional;Grief support

## 2020-03-15 NOTE — Discharge Summary (Signed)
Postpartum Discharge Summary  Date of Service updated      Patient Name: Chelsey Hess DOB: 1976-05-05 MRN: [id number]  Date of admission: 03/13/2020 Delivery date:03/14/2020  Delivering provider: Deliah Boston  Date of discharge: 03/15/2020  Admitting diagnosis: IUFD at 24 weeks or more of gestation [O36.4XX0] Intrauterine pregnancy: [redacted]w[redacted]d    Secondary diagnosis:  Principal Problem:   IUFD at 42 weeksor more of gestation  Additional problems: Hx brachial clot    Discharge diagnosis: s/p preterm svd of IUFD                                              Post partum procedures:n/a Augmentation: Cytotec and IP [facility]oley [facility]: [facility] With Vaginal Delivery   44y.o. yo [id number] admitted to the hospital 03/13/2020 for induction of labor.  Indication for induction: IUFD.  Patient had an uncomplicated labor course as follows: Membrane Rupture Time/Date: 1:33 PM ,03/14/2020   Delivery Method:Vaginal, Spontaneous  Episiotomy: None  Lacerations:  None  Details of delivery can be found in separate delivery note.  Patient had a routine postpartum course. Patient is discharged home 03/15/20.  Newborn Data: Birth date:03/14/2020  Birth time:1:43 PM  Gender:Female  Living status:Fetal Demise  Apgars: ,  Weight:1055 g   Magnesium Sulfate received: No BMZ received: No Rhophylac:N/A MMR:N/A T-DaP:n/a Flu: N/A Transfusion:No  Physical exam  Vitals:   03/14/20 1921 03/14/20 2234 03/15/20 0406 03/15/20 0755  BP:  (!) 101/50 (!) 110/57 (!) 112/53  Pulse:  78 78 86  Resp:  '17 18 18  ' Temp:  98.5 F (36.9 C) 98.1 F (36.7 C) (!) 97.5 F (36.4 C)  TempSrc:  Oral Oral Oral  SpO2: 99% 99% 99% 98%  Weight:      Height:       General: alert and cooperative Lochia: appropriate Uterine Fundus: firm Incision: N/A DVT Evaluation: No evidence of DVT seen on physical exam. Labs: Lab Results  Component Value Date    WBC 11.7 (H) 03/15/2020   HGB 11.2 (L) 03/15/2020   HCT 34.4 (L) 03/15/2020   MCV 86.6 03/15/2020   PLT 134 (L) 03/15/2020   CMP Latest Ref Rng & Units 03/12/2020  Glucose 70 - 99 mg/dL 120(H)  BUN 6 - 20 mg/dL 8  Creatinine 0.44 - 1.00 mg/dL 0.51  Sodium 135 - 145 mmol/L 138  Potassium 3.5 - 5.1 mmol/L 4.1  Chloride 98 - 111 mmol/L 105  CO2 22 - 32 mmol/L 22  Calcium 8.9 - 10.3 mg/dL 10.0  Total Protein 6.5 - 8.1 g/dL -  Total Bilirubin 0.3 - 1.2 mg/dL -  Alkaline Phos 38 - 126 U/L -  AST 15 - 41 U/L -  ALT 0 - 44 U/L -   Edinburgh Score: No flowsheet data found.    After visit meds:  Allergies as of 03/15/2020   No Known Allergies     Medication List    TAKE these medications   enoxaparin 80 MG/0.8ML injection Commonly known as: LOVENOX Inject 80 mg into the skin in the morning and at bedtime.   ibuprofen 600 MG tablet Commonly known as: ADVIL Take 1 tablet (600 mg total) by mouth every 6 (six) hours as needed for cramping.        Discharge home  in stable condition Infant Feeding: IUFD Infant Disposition:morgue Discharge instruction: per After Visit Summary and Postpartum booklet. Activity: Advance as tolerated. Pelvic rest for 6 weeks.  Diet: routine diet Anticipated Birth Control: Unsure Postpartum Appointment:3-4 weeks Additional Postpartum F/U: Postpartum Depression checkup Future Appointments: Future Appointments  Date Time Provider [place]  04/18/2020  9:15 AM [facility] LAB [facility] None  04/18/2020  9:45 AM [place], Holli Humbles, NP [facility] None  04/25/2020  9:30 AM [facility] None   Follow up Visit:  Follow-up Information    Shivaji, Melida Quitter, MD. Schedule an appointment as soon as possible for a visit.   Specialty: Obstetrics and Gynecology Why: 3-4 weeks for postpartum visit Contact information: [address] [place] [address] [phone]                   03/15/2020 Isaiah Serge, DO

## 2020-03-16 LAB — SURGICAL PATHOLOGY

## 2020-04-13 ENCOUNTER — Encounter: Payer: Self-pay | Admitting: Family

## 2020-04-14 ENCOUNTER — Telehealth: Payer: Self-pay | Admitting: *Deleted

## 2020-04-14 ENCOUNTER — Telehealth: Payer: Self-pay | Admitting: Family

## 2020-04-14 NOTE — Telephone Encounter (Signed)
My chart message was sent. Per 9/30   sch msg

## 2020-04-14 NOTE — Telephone Encounter (Signed)
Call received from patient concerned that she can not come to this office for 21 days after testing positive for Covid-19 on 04/12/20 and that she will need lab work done prior to the 21 day window.  Informed pt that a prescription for lab work can be mailed to her to have blood work drawn at [facility].  Economist of assistance and states that she will go to [facility] to have blood work drawn. Jory Ee NP notified.

## 2020-04-15 ENCOUNTER — Encounter: Payer: Self-pay | Admitting: *Deleted

## 2020-04-15 NOTE — Progress Notes (Signed)
This nurse mailed a prescription to have labs drawn at [facility] her home. She needs a CBC, CMP, Protein S Activity and Protein S Total, per Sarah Cincinati,NP.

## 2020-04-18 ENCOUNTER — Inpatient Hospital Stay: Payer: 59 | Admitting: Family

## 2020-04-18 ENCOUNTER — Inpatient Hospital Stay: Payer: 59

## 2020-04-20 ENCOUNTER — Telehealth: Payer: Self-pay | Admitting: Family

## 2020-04-20 NOTE — Telephone Encounter (Signed)
I was able to speak with Chelsey Hess and go over a few questions with her about future pregnancy. She plans to see a fertility specialist and discuss invitro. She will let us know once she has spoken with them and update [place] on their plan of care.  She will make sure to let us know as soon as she has a positive pregnancy test and we will get her start on Lovenox immediately.  No other questions at this time.

## 2020-04-25 ENCOUNTER — Inpatient Hospital Stay: Payer: 59

## 2020-04-26 ENCOUNTER — Telehealth: Payer: Self-pay | Admitting: *Deleted

## 2020-04-26 NOTE — Telephone Encounter (Signed)
This nurse returned patient's phone call but was unable to leave a message because cell phone mailbox is full.

## 2020-04-27 ENCOUNTER — Telehealth: Payer: Self-pay | Admitting: *Deleted

## 2020-04-27 NOTE — Telephone Encounter (Signed)
I returned patient's phone call regarding lab results and Lovenox injections. I informed the patient that her labs from 04/22/2020, drawn at [place] look better. Per Laverna Peace, NP, we don't do anything with Lovenox injections until you get pregnant. I asked patient, " what is your infertility plan? Have you seen a fertility specialists?" Patient stated,"I am seeing one October 27th." I told her to send a MyChart message after that appointment and let us know what the plan is going to be. She verbalized understanding.

## 2020-05-02 ENCOUNTER — Other Ambulatory Visit: Payer: Self-pay

## 2020-05-02 ENCOUNTER — Inpatient Hospital Stay (HOSPITAL_BASED_OUTPATIENT_CLINIC_OR_DEPARTMENT_OTHER): Payer: 59 | Admitting: Family

## 2020-05-02 ENCOUNTER — Inpatient Hospital Stay: Payer: 59 | Attending: Family

## 2020-05-02 ENCOUNTER — Encounter: Payer: Self-pay | Admitting: Family

## 2020-05-02 VITALS — BP 126/66 | HR 70 | Temp 98.0°F | Resp 18 | Ht 68.0 in | Wt 161.8 lb

## 2020-05-02 DIAGNOSIS — D6859 Other primary thrombophilia: Secondary | ICD-10-CM | POA: Diagnosis not present

## 2020-05-02 DIAGNOSIS — I82622 Acute embolism and thrombosis of deep veins of left upper extremity: Secondary | ICD-10-CM | POA: Diagnosis not present

## 2020-05-02 DIAGNOSIS — Z86718 Personal history of other venous thrombosis and embolism: Secondary | ICD-10-CM | POA: Insufficient documentation

## 2020-05-02 NOTE — Progress Notes (Signed)
Hematology and Oncology Follow Up Visit  Chelsey Hess [id number] 1976-05-14 44 y.o. 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 Unfortunately, Chelsey Hess lost Chelsey Hess sweet baby at 28 weeks. Chelsey Hess states that the baby had a heart defect and they are unsure if that is the cause. Our hearts are broken for both Chelsey Hess and Chelsey Hess husband.  Chelsey Hess has an appointment next week on Monday with Chelsey Hess fertility specialist. They are going to test Chelsey Hess husband for an genetic abnormalities and determine a plan of care for Chelsey Hess as they would like to try again for a baby.  Chelsey Hess states that Chelsey Hess started Chelsey Hess first cycle since losing the baby on 9/29 and is still on. Chelsey Hess states that it seems to be getting heavier. Chelsey Hess finished Chelsey Hess last Lovenox last Monday a week ago. Chelsey Hess will contact Chelsey Hess gynecologist and let them know about this.  Chelsey Hess had Covid earlier this month along with Chelsey Hess husband and kids. They are all feeling much better.  Chelsey Hess did not want labs drawn again today. Protein S studies last week were stable.  No other blood loss noted. Chelsey Hess bruises easily but not in excess. No petechiae.  No fever, chills, n/v, cough, rash, dizziness, SOB, chest pain, palpitations, abdominal pain or changes in bowel or bladder habits.  No swelling, tenderness, numbness or tingling in Chelsey Hess extremities.  No falls or syncope.  Chelsey Hess has maintained a good appetite and is staying well hydrated. Chelsey Hess weight is stable.   ECOG Performance Status: 1 - Symptomatic but completely ambulatory  Medications:  Allergies as of 05/02/2020   No Known Allergies     Medication List       Accurate as of May 02, 2020  3:49 PM. If you have any questions, ask your nurse or doctor.        STOP taking these medications   enoxaparin  80 MG/0.8ML injection Commonly known as: LOVENOX Stopped by: Laverna Peace, NP   ibuprofen 600 MG tablet Commonly known as: ADVIL Stopped by: Laverna Peace, NP       Allergies: No Known Allergies  Past Medical History, Surgical history, Social history, and Family History were reviewed and updated.  Review of Systems: All other 10 point review of systems is negative.   Physical Exam:  height is 5\' 8"  (1.727 m) and weight is 161 lb 12.8 oz (73.4 kg). Chelsey Hess oral temperature is 98 F (36.7 C). Chelsey Hess blood pressure is 126/66 and Chelsey Hess pulse is 70. Chelsey Hess respiration is 18 and oxygen saturation is 100%.   Wt Readings from Last 3 Encounters:  05/02/20 161 lb 12.8 oz (73.4 kg)  03/13/20 164 lb (74.4 kg)  03/12/20 168 lb 4.8 oz (76.3 kg)    Ocular: Sclerae unicteric, pupils equal, round and reactive to light Ear-nose-throat: Oropharynx clear, dentition fair Lymphatic: No cervical, supraclavicular or axillary adenopathy Lungs no rales or rhonchi, good excursion bilaterally Heart regular rate and rhythm, no murmur appreciated Abd soft, nontender, positive bowel sounds, no liver or spleen tip palpated on exam, no fluid wave  MSK no focal spinal tenderness, no joint edema Neuro: non-focal, well-oriented, appropriate affect Breasts: Deferred   Lab Results  Component Value Date   WBC 11.7 (H) 03/15/2020  HGB 11.2 (L) 03/15/2020   HCT 34.4 (L) 03/15/2020   MCV 86.6 03/15/2020   PLT 134 (L) 03/15/2020   Lab Results  Component Value Date   FERRITIN 18 12/01/2019   IRON 130 12/01/2019   TIBC 414 12/01/2019   UIBC 283 12/01/2019   IRONPCTSAT 32 12/01/2019   Lab Results  Component Value Date   RETICCTPCT 1.6 12/01/2019   RBC 3.97 03/15/2020   No results found for: KPAFRELGTCHN, LAMBDASER, KAPLAMBRATIO No results found for: IGGSERUM, IGA, IGMSERUM No results found for: Odetta Pink, SPEI   Chemistry      Component Value  Date/Time   NA 138 03/12/2020 2335   K 4.1 03/12/2020 2335   CL 105 03/12/2020 2335   CO2 22 03/12/2020 2335   BUN 8 03/12/2020 2335   CREATININE 0.51 03/12/2020 2335   CREATININE 0.57 02/08/2020 1040      Component Value Date/Time   CALCIUM 10.0 03/12/2020 2335   ALKPHOS 36 (L) 02/08/2020 1040   AST 13 (L) 02/08/2020 1040   ALT 18 02/08/2020 1040   BILITOT 0.3 02/08/2020 1040       Impression and Plan: Chelsey Hess is a very pleasant 44 yo [place] female with history of 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  We are so sad to hear that Chelsey Hess lost Chelsey Hess sweet baby. Chelsey Hess will see the fertility specialist next week and then let us know their plan of care.  Chelsey Hess will need to be on Lovenox BID throughout Chelsey Hess pregnancy and 6 months after delivery.  Chelsey Hess verbalized understanding and will keep [place] updated.  Follow-up in 3 months.  Chelsey Hess was encouraged to contact our office with any questions or concerns.   Laverna Peace, NP 10/18/20213:49 PM

## 2020-05-03 ENCOUNTER — Telehealth: Payer: Self-pay | Admitting: Family

## 2020-05-03 NOTE — Telephone Encounter (Signed)
Appointments scheduled calendar printed & mailed per 10/18 los 

## 2020-05-16 ENCOUNTER — Inpatient Hospital Stay (HOSPITAL_COMMUNITY): Admit: 2020-05-16 | Payer: 59 | Admitting: Obstetrics and Gynecology

## 2020-05-16 ENCOUNTER — Encounter (HOSPITAL_COMMUNITY): Payer: Self-pay

## 2020-05-16 SURGERY — Surgical Case
Anesthesia: Spinal | Laterality: Bilateral

## 2020-06-16 ENCOUNTER — Telehealth: Payer: Self-pay

## 2020-06-16 NOTE — Telephone Encounter (Signed)
Received forwarded message from scheduler stating pt called reporting she is pregnant and asking if she should re-start Lovenox.   Per Judson Roch, NP pt should resume previous dose of Lovenox 80mg  bid. Keep appt as scheduled on 08/03/2020.   Contacted pt who reports she had a positive pregnancy test yesterday and a negative test today. She has not contacted her OB yet. She does have a few days of Lovenox at home. Advised pt to contact OB for serum preg test to confirm & to update this office with results. Pt verbalizes understanding using teachback. dph

## 2020-06-28 ENCOUNTER — Other Ambulatory Visit: Payer: Self-pay | Admitting: Obstetrics and Gynecology

## 2020-06-28 DIAGNOSIS — N644 Mastodynia: Secondary | ICD-10-CM

## 2020-07-12 ENCOUNTER — Ambulatory Visit: Payer: 59

## 2020-07-12 ENCOUNTER — Ambulatory Visit
Admission: RE | Admit: 2020-07-12 | Discharge: 2020-07-12 | Disposition: A | Discharge status: Home / Self Care | Payer: 59 | Source: Ambulatory Visit | Attending: Obstetrics and Gynecology | Admitting: Obstetrics and Gynecology

## 2020-07-12 ENCOUNTER — Other Ambulatory Visit: Payer: Self-pay

## 2020-07-12 DIAGNOSIS — N644 Mastodynia: Secondary | ICD-10-CM

## 2020-08-02 ENCOUNTER — Encounter: Payer: Self-pay | Admitting: Hematology & Oncology

## 2020-08-03 ENCOUNTER — Inpatient Hospital Stay: Payer: 59

## 2020-08-03 ENCOUNTER — Inpatient Hospital Stay: Payer: 59 | Admitting: Hematology & Oncology

## 2020-08-04 ENCOUNTER — Encounter: Payer: Self-pay | Admitting: *Deleted

## 2020-08-04 ENCOUNTER — Other Ambulatory Visit: Payer: Self-pay | Admitting: Family

## 2020-08-04 DIAGNOSIS — Z8759 Personal history of other complications of pregnancy, childbirth and the puerperium: Secondary | ICD-10-CM

## 2020-08-04 DIAGNOSIS — I82622 Acute embolism and thrombosis of deep veins of left upper extremity: Secondary | ICD-10-CM

## 2020-08-04 DIAGNOSIS — D6859 Other primary thrombophilia: Secondary | ICD-10-CM

## 2020-08-04 DIAGNOSIS — Z3A01 Less than 8 weeks gestation of pregnancy: Secondary | ICD-10-CM

## 2020-08-04 MED ORDER — ENOXAPARIN SODIUM 100 MG/ML ~~LOC~~ SOLN: 100.0000 mg | SUBCUTANEOUS | 3 refills | Status: DC

## 2020-08-04 NOTE — Progress Notes (Signed)
Patient contacted our office with news of positive pregnancy test. We will get her only therapeutic Lovenox 100 mg SQ daily per MD. She has follow-up scheduled with Dr. Marin Olp on 08/29/2020. No other questions at this time. Was encouraged to call with any concerns.

## 2020-08-17 ENCOUNTER — Telehealth: Payer: Self-pay

## 2020-08-17 NOTE — Telephone Encounter (Signed)
Pateint called and left a message asking if she was on to high of a dose of lovenox, that her HCG seemed low and wanted to know If she needs to lower her lovenox.  Called and informed pateint her lovenox is weight based and is not based on HCG but per Judson Roch it is a prophylactic dose until pregnancy is confirmed by OBGYN and will adjust dose if needed after that. patient states she follows up with OB on 2/8, informed her to call our offices if any changes prior to her appt with Dr.Ennever on 08/29/20. patient verbalized understanding and denies any other questions or concerns at this time.

## 2020-08-29 ENCOUNTER — Inpatient Hospital Stay (HOSPITAL_BASED_OUTPATIENT_CLINIC_OR_DEPARTMENT_OTHER): Payer: 59 | Admitting: Hematology & Oncology

## 2020-08-29 ENCOUNTER — Inpatient Hospital Stay: Payer: 59 | Attending: Family

## 2020-08-29 ENCOUNTER — Encounter: Payer: Self-pay | Admitting: Hematology & Oncology

## 2020-08-29 ENCOUNTER — Other Ambulatory Visit: Payer: Self-pay

## 2020-08-29 VITALS — BP 117/61 | HR 73 | Temp 97.6°F | Resp 18 | Wt 164.0 lb

## 2020-08-29 DIAGNOSIS — O358XX0 Maternal care for other (suspected) fetal abnormality and damage, not applicable or unspecified: Secondary | ICD-10-CM

## 2020-08-29 DIAGNOSIS — D6859 Other primary thrombophilia: Secondary | ICD-10-CM | POA: Diagnosis not present

## 2020-08-29 DIAGNOSIS — O35BXX0 Maternal care for other (suspected) fetal abnormality and damage, fetal cardiac anomalies, not applicable or unspecified: Secondary | ICD-10-CM

## 2020-08-29 DIAGNOSIS — Z7901 Long term (current) use of anticoagulants: Secondary | ICD-10-CM | POA: Insufficient documentation

## 2020-08-29 DIAGNOSIS — Z86718 Personal history of other venous thrombosis and embolism: Secondary | ICD-10-CM | POA: Insufficient documentation

## 2020-08-29 DIAGNOSIS — I82622 Acute embolism and thrombosis of deep veins of left upper extremity: Secondary | ICD-10-CM

## 2020-08-29 DIAGNOSIS — N96 Recurrent pregnancy loss: Secondary | ICD-10-CM | POA: Diagnosis not present

## 2020-08-29 LAB — CBC WITH DIFFERENTIAL (CANCER CENTER ONLY)
Abs Immature Granulocytes: 0.03 10*3/uL (ref 0.00–0.07)
Basophils Absolute: 0.1 10*3/uL (ref 0.0–0.1)
Basophils Relative: 1 %
Eosinophils Absolute: 0.4 10*3/uL (ref 0.0–0.5)
Eosinophils Relative: 6 %
HCT: 42 % (ref 36.0–46.0)
Hemoglobin: 13.8 g/dL (ref 12.0–15.0)
Immature Granulocytes: 1 %
Lymphocytes Relative: 27 %
Lymphs Abs: 1.7 10*3/uL (ref 0.7–4.0)
MCH: 26.7 pg (ref 26.0–34.0)
MCHC: 32.9 g/dL (ref 30.0–36.0)
MCV: 81.2 fL (ref 80.0–100.0)
Monocytes Absolute: 0.5 10*3/uL (ref 0.1–1.0)
Monocytes Relative: 8 %
Neutro Abs: 3.7 10*3/uL (ref 1.7–7.7)
Neutrophils Relative %: 57 %
Platelet Count: ADEQUATE 10*3/uL (ref 150–400)
RBC: 5.17 MIL/uL — ABNORMAL HIGH (ref 3.87–5.11)
RDW: 13 % (ref 11.5–15.5)
WBC Count: 6.4 10*3/uL (ref 4.0–10.5)
nRBC: 0 % (ref 0.0–0.2)

## 2020-08-29 LAB — CMP (CANCER CENTER ONLY)
ALT: 14 U/L (ref 0–44)
AST: 13 U/L — ABNORMAL LOW (ref 15–41)
Albumin: 4.5 g/dL (ref 3.5–5.0)
Alkaline Phosphatase: 40 U/L (ref 38–126)
Anion gap: 10 (ref 5–15)
BUN: 19 mg/dL (ref 6–20)
CO2: 26 mmol/L (ref 22–32)
Calcium: 9.4 mg/dL (ref 8.9–10.3)
Chloride: 104 mmol/L (ref 98–111)
Creatinine: 0.7 mg/dL (ref 0.44–1.00)
GFR, Estimated: >60 mL/min (ref >60)
Glucose, Bld: 98 mg/dL (ref 70–99)
Potassium: 3.6 mmol/L (ref 3.5–5.1)
Sodium: 140 mmol/L (ref 135–145)
Total Bilirubin: 0.5 mg/dL (ref 0.3–1.2)
Total Protein: 6.6 g/dL (ref 6.5–8.1)

## 2020-08-29 NOTE — Progress Notes (Signed)
Hematology and Oncology Follow Up Visit  Chelsey Hess [id number] 12-09-1975 45 y.o. 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  Unfortunately, she has another miscarriage.  This is a recurrent issue for her.  We found out that she was pregnant recently.  We got her on Lovenox.  She was on Lovenox 100 mg subcu daily.  She started this a few weeks ago.  However, when she went to the doctor's appointment last week, she was told that she is got a miscarriage.  She is currently in the process of doing that.  I just am not sure as to why she keeps having the miscarriages.  I just feel bad for her.  I know she has 3 children already but would like to have a another child with her husband.  She is a good woman.  She is very strong.  She stopped the Lovenox on Friday.  She was having some bleeding.  She has had no cough or shortness of breath.  There is been no nausea or vomiting.  She has had no obvious change in bowel or bladder habits.  She has had no leg swelling.  There is been no arm swelling.  Overall, I would say her performance status is ECOG 0.    Medications:  Allergies as of 08/29/2020   No Known Allergies     Medication List       Accurate as of August 29, 2020  8:29 AM. If you have any questions, ask your nurse or doctor.        STOP taking these medications   enoxaparin 100 MG/ML injection Commonly known as: Lovenox Stopped by: Volanda Napoleon, MD       Allergies: No Known Allergies  Past Medical History, Surgical history, Social history, and Family History were reviewed and updated.  Review of Systems: Review of Systems  Constitutional: Negative.   HENT: Negative.   Eyes:  Negative.   Respiratory: Negative.   Cardiovascular: Negative.   Gastrointestinal: Negative.   Genitourinary: Negative.   Musculoskeletal: Negative.   Skin: Negative.   Neurological: Negative.   Endo/Heme/Allergies: Bruises/bleeds easily.  Psychiatric/Behavioral: Negative.      Physical Exam:  weight is 164 lb (74.4 kg). Her oral temperature is 97.6 F (36.4 C). Her blood pressure is 117/61 and her pulse is 73. Her respiration is 18 and oxygen saturation is 100%.   Wt Readings from Last 3 Encounters:  08/29/20 164 lb (74.4 kg)  05/02/20 161 lb 12.8 oz (73.4 kg)  03/13/20 164 lb (74.4 kg)    Physical Exam Vitals reviewed.  HENT:     Head: Normocephalic and atraumatic.     Mouth/Throat:     Mouth: Oropharynx is clear and moist.  Eyes:     Extraocular Movements: EOM normal.     Pupils: Pupils are equal, round, and reactive to light.  Cardiovascular:     Rate and Rhythm: Normal rate and regular rhythm.     Heart sounds: Normal heart sounds.  Pulmonary:     Effort: Pulmonary effort is normal.     Breath sounds: Normal breath sounds.  Abdominal:     General: Bowel sounds  are normal.     Palpations: Abdomen is soft.  Musculoskeletal:        General: No tenderness, deformity or edema. Normal range of motion.     Cervical back: Normal range of motion.  Lymphadenopathy:     Cervical: No cervical adenopathy.  Skin:    General: Skin is warm and dry.     Findings: No erythema or rash.  Neurological:     Mental Status: She is alert and oriented to person, place, and time.  Psychiatric:        Mood and Affect: Mood and affect normal.        Behavior: Behavior normal.        Thought Content: Thought content normal.        Judgment: Judgment normal.      Lab Results  Component Value Date   WBC 6.4 08/29/2020   HGB 13.8 08/29/2020   HCT 42.0 08/29/2020   MCV 81.2 08/29/2020   PLT  08/29/2020    PLATELET CLUMPS NOTED ON SMEAR, COUNT APPEARS ADEQUATE   Lab Results   Component Value Date   FERRITIN 18 12/01/2019   IRON 130 12/01/2019   TIBC 414 12/01/2019   UIBC 283 12/01/2019   IRONPCTSAT 32 12/01/2019   Lab Results  Component Value Date   RETICCTPCT 1.6 12/01/2019   RBC 5.17 (H) 08/29/2020   No results found for: KPAFRELGTCHN, LAMBDASER, KAPLAMBRATIO No results found for: IGGSERUM, IGA, IGMSERUM No results found for: Odetta Pink, SPEI   Chemistry      Component Value Date/Time   NA 140 08/29/2020 0743   K 3.6 08/29/2020 0743   CL 104 08/29/2020 0743   CO2 26 08/29/2020 0743   BUN 19 08/29/2020 0743   CREATININE 0.70 08/29/2020 0743      Component Value Date/Time   CALCIUM 9.4 08/29/2020 0743   ALKPHOS 40 08/29/2020 0743   AST 13 (L) 08/29/2020 0743   ALT 14 08/29/2020 0743   BILITOT 0.5 08/29/2020 0743       Impression and Plan: [name] is a very pleasant 45 yo Chelsey Hess female with history of 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   I just feel horrible that she lost she had another baby.  This was certainly early on in the pregnancy.  She was on therapeutic Lovenox.  She says that she does not want to try for another baby.  This is very hard on her emotionally.  I totally understand this.  For right now, we will just have her call us if she needs [place].  If she does get pregnant again we will get her back on Lovenox.  Hopefully, we will be able to figure out why she has these miscarriages.  She is such a good woman.  I know she has 3 children but she certainly would be a wonderful mother to a new baby.     Volanda Napoleon, MD 2/14/20228:29 AM

## 2020-08-30 ENCOUNTER — Telehealth: Payer: Self-pay | Admitting: *Deleted

## 2020-08-30 LAB — PROTEIN S, TOTAL: Protein S Ag, Total: 87 % (ref 60–150)

## 2020-08-30 LAB — PROTEIN S ACTIVITY: Protein S Activity: 43 % — ABNORMAL LOW (ref 63–140)

## 2020-08-30 NOTE — Telephone Encounter (Signed)
No 08/29/20 los

## 2021-01-25 IMAGING — DX DG CHEST 2V
2 series · 2 of 2 positions shown · non-contrast
Comparison: 10/23/2019

CLINICAL DATA: Bilateral back pain. Recent left lower lobe
pneumonia

EXAM:
CHEST - 2 VIEW

[chest pa]
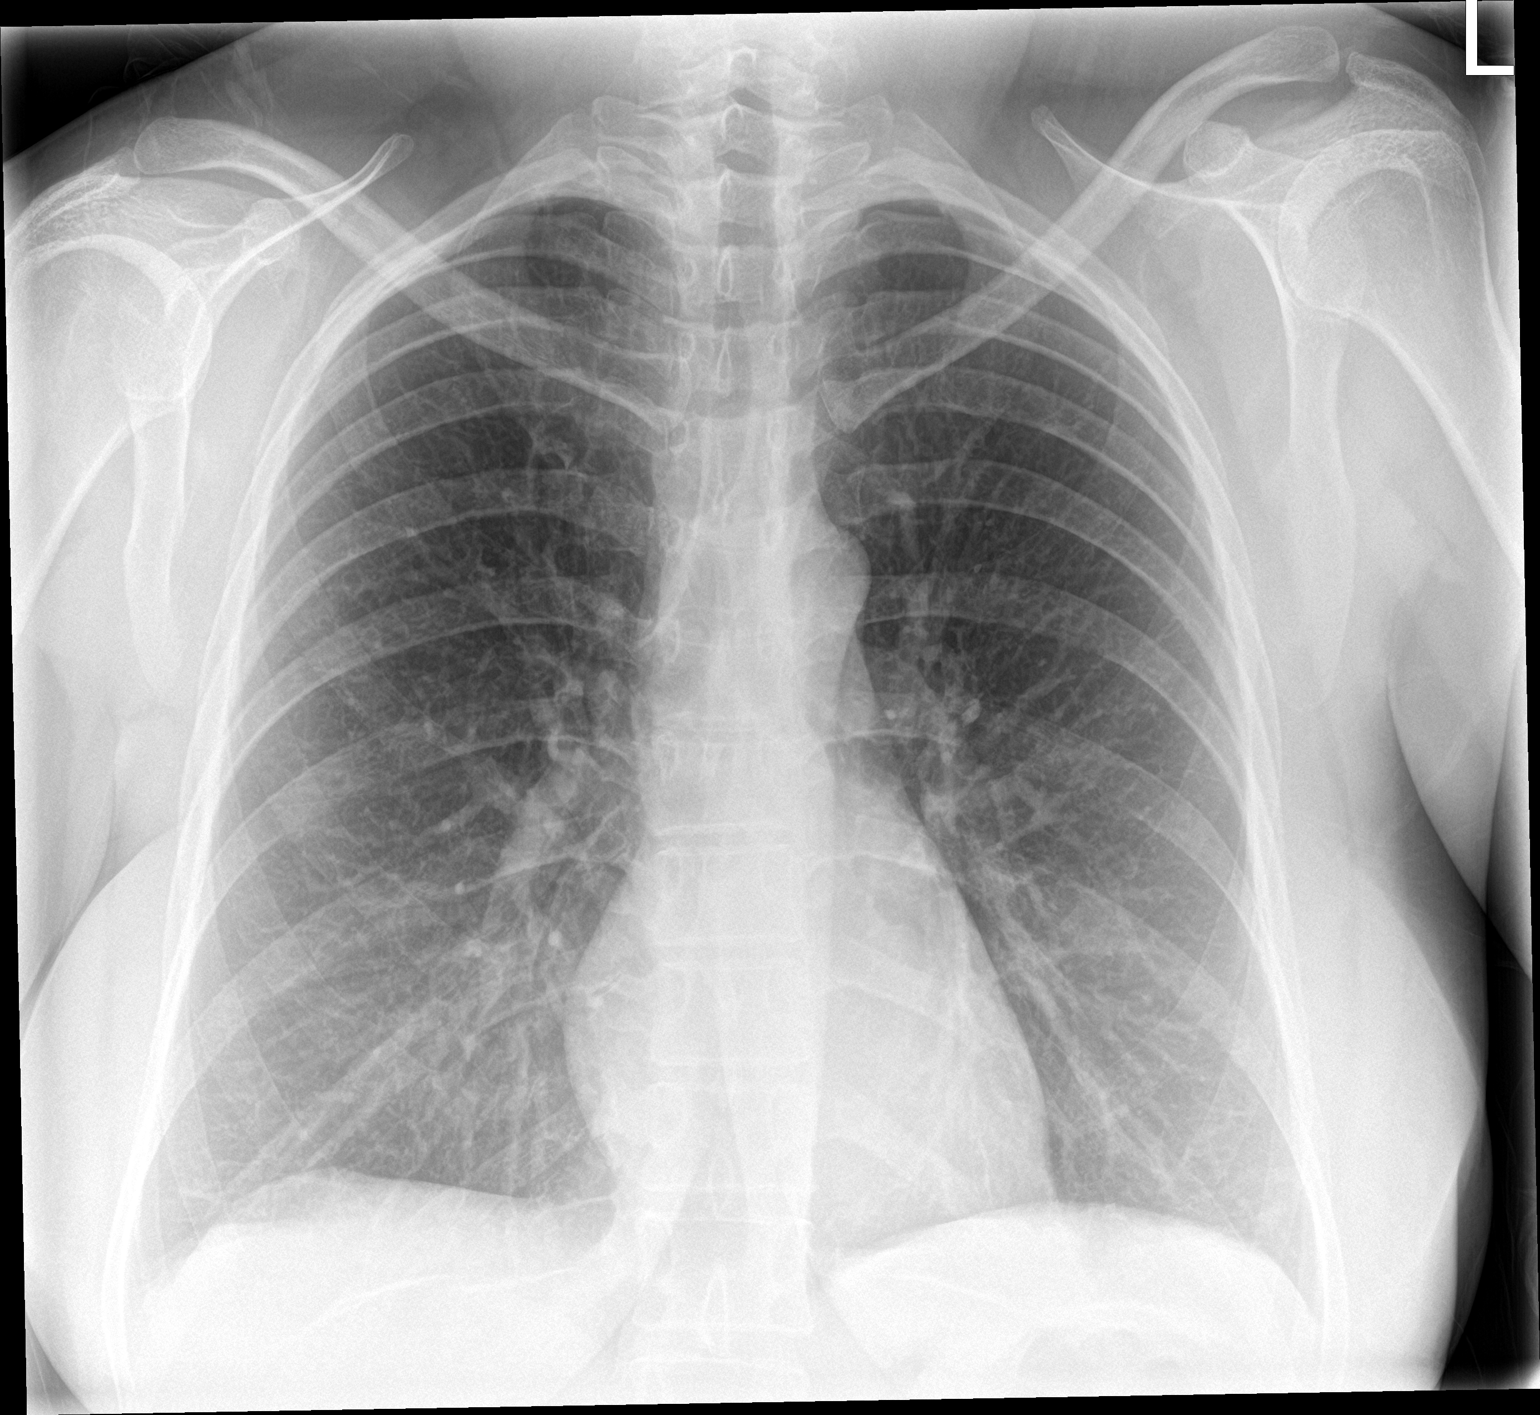

[chest lat]
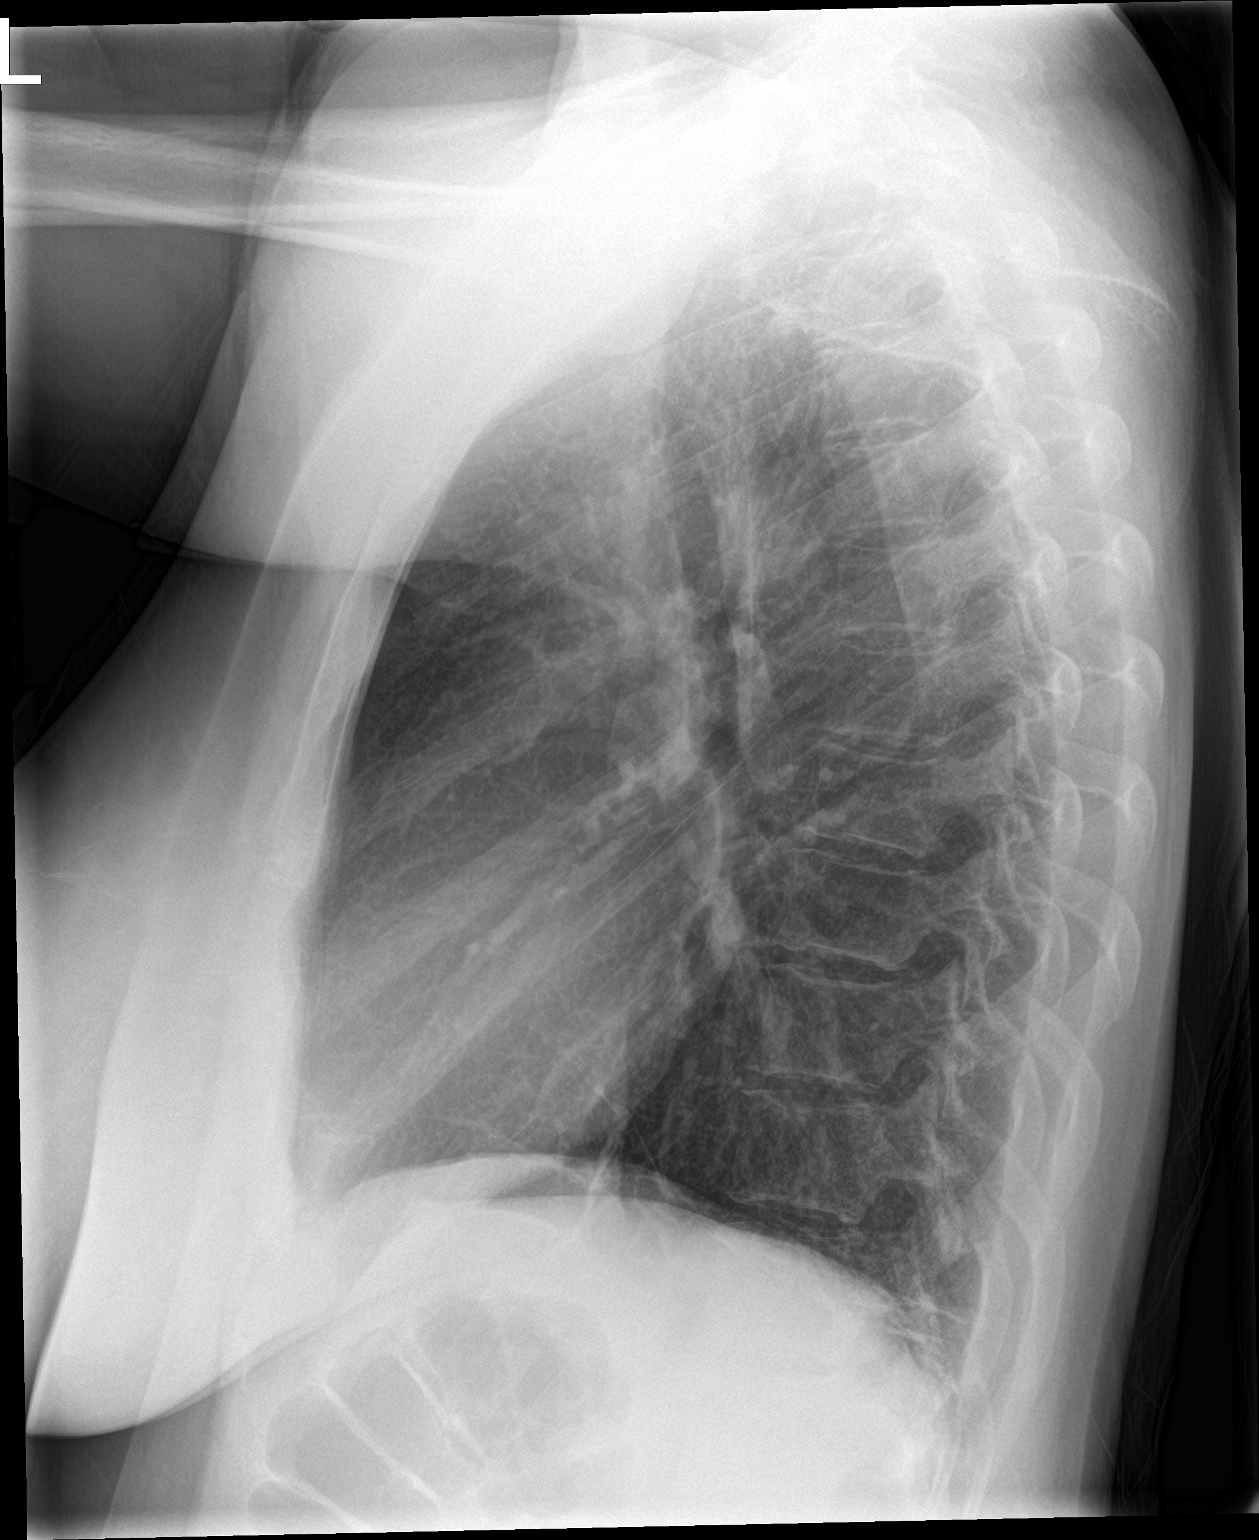

[2 of 2 positions shown; findings below may reference images not displayed]

FINDINGS: Previously seen left lower lobe pneumonia has resolved. No confluent
opacities currently. Heart is normal size. No effusions. No acute
bony abnormality.
IMPRESSION: Resolution of previously seen left lower lobe pneumonia.

No active disease.

## 2021-03-11 IMAGING — US US EXTREM LOW VENOUS*R*
1 series · 14 of 24 positions shown · non-contrast
Comparison: No recent prior.

CLINICAL DATA: Pain and swelling.

EXAM:
RIGHT LOWER EXTREMITY VENOUS DOPPLER ULTRASOUND
TECHNIQUE: Gray-scale sonography with compression, as well as color and duplex
ultrasound, were performed to evaluate the deep venous system(s)
from the level of the common femoral vein through the popliteal and
proximal calf veins.

[Series 1: us extrem low venous*right* · 14 of 32 slices shown]
[im 1/32]
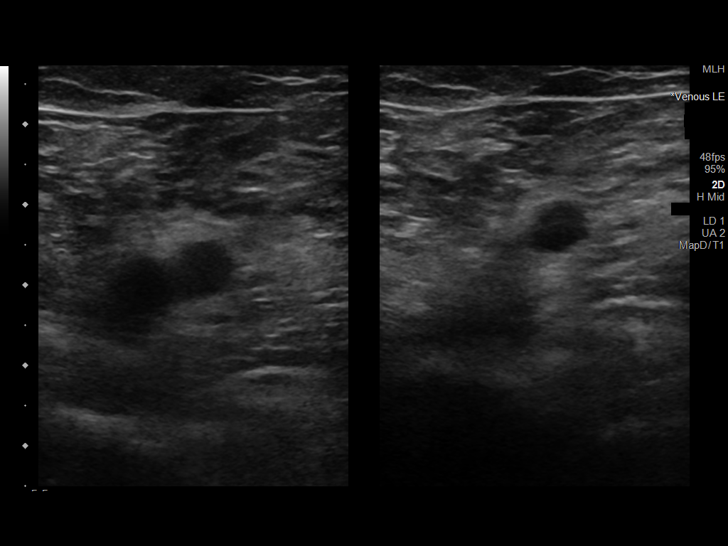
[im 3/32]
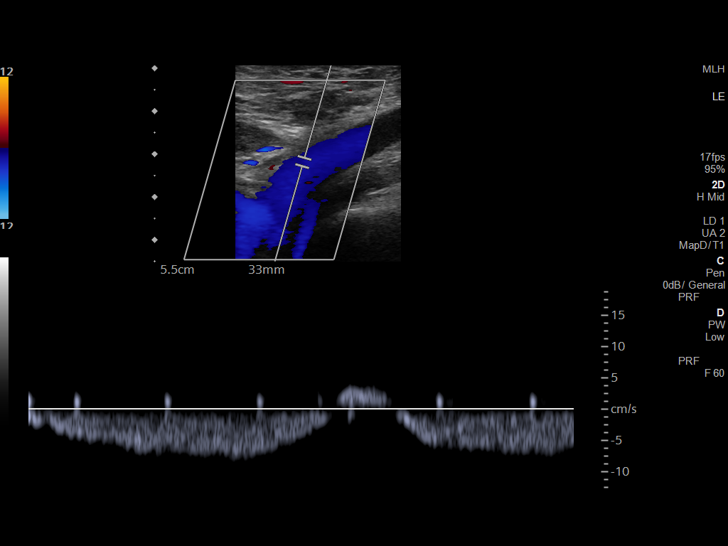
[im 6/32]
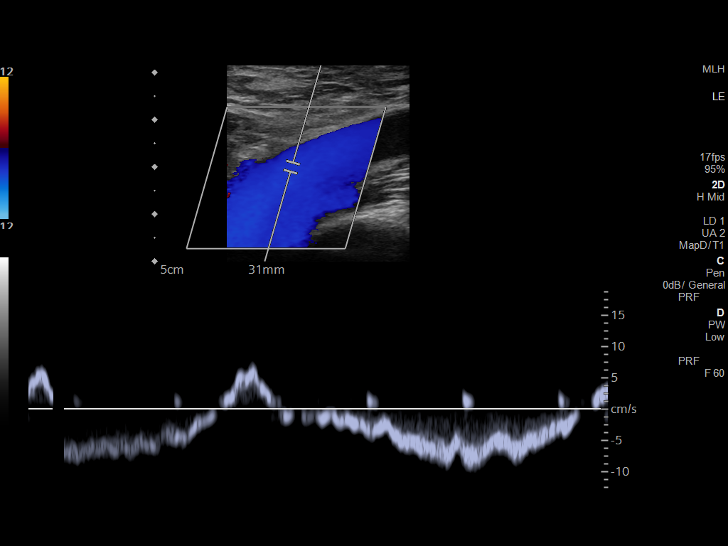
[im 9/32]
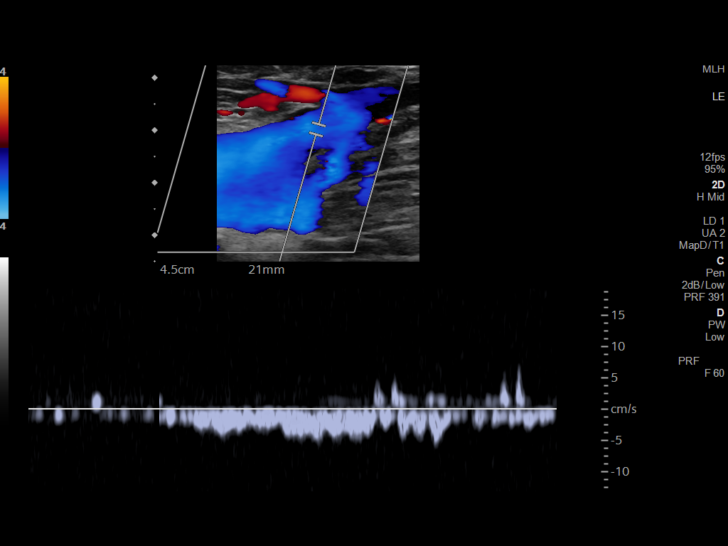
[im 10/32]
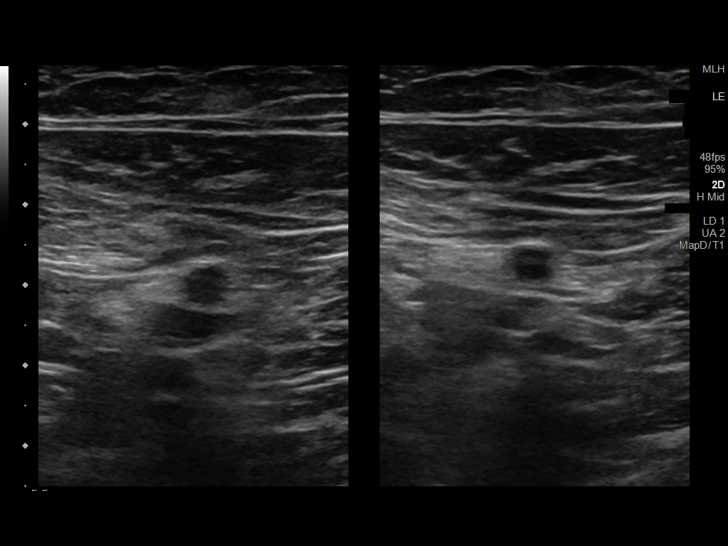
[im 13/32]
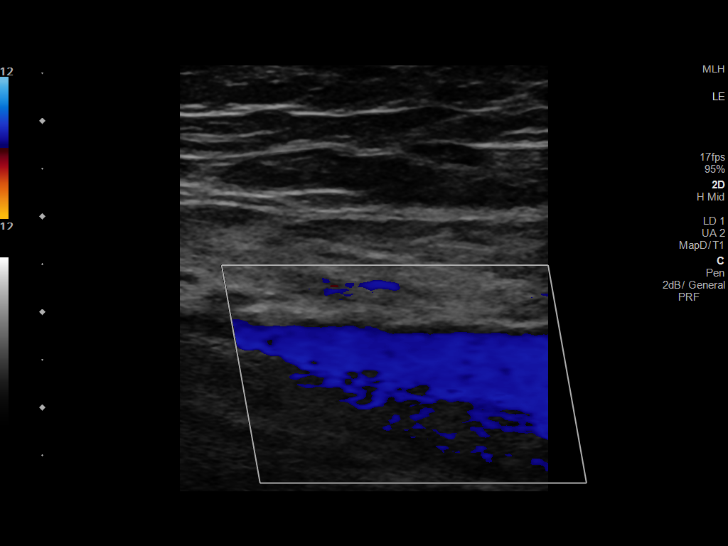
[im 15/32]
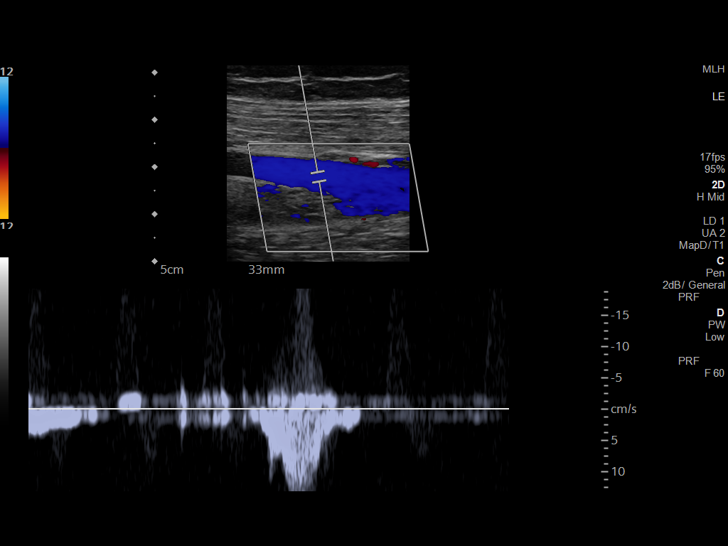
[im 17/32]
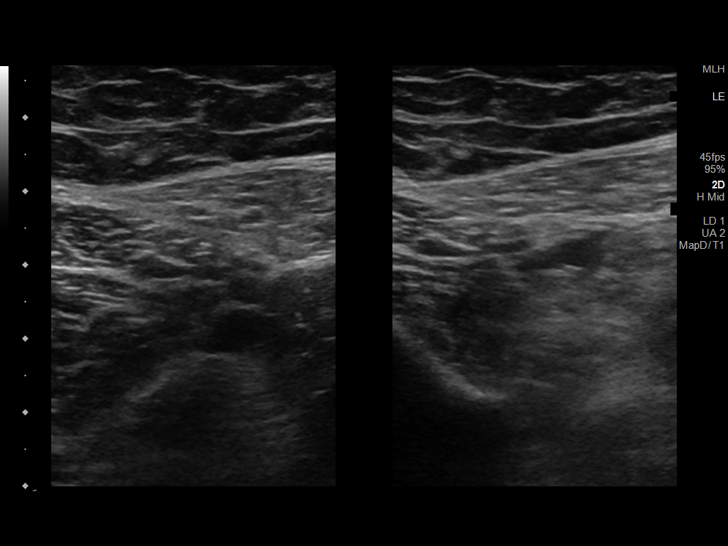
[im 19/32]
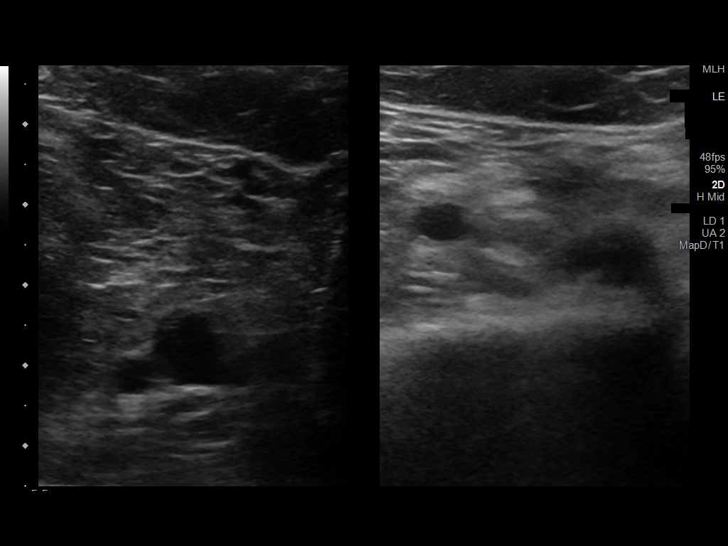
[im 22/32]
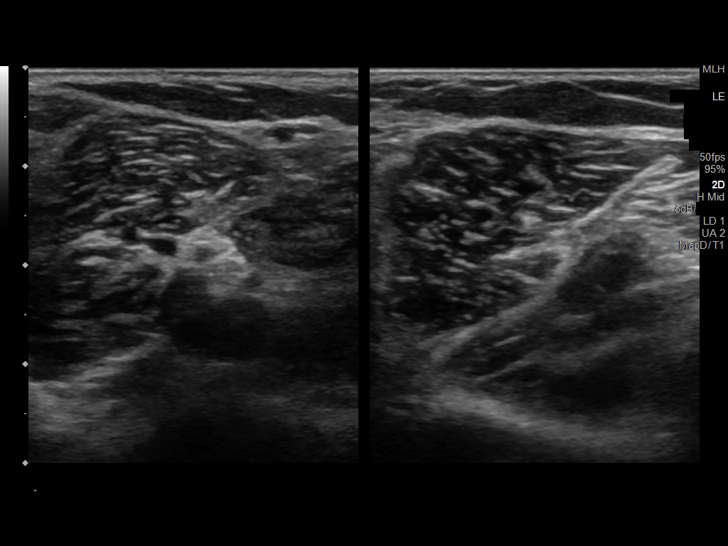
[im 25/32]
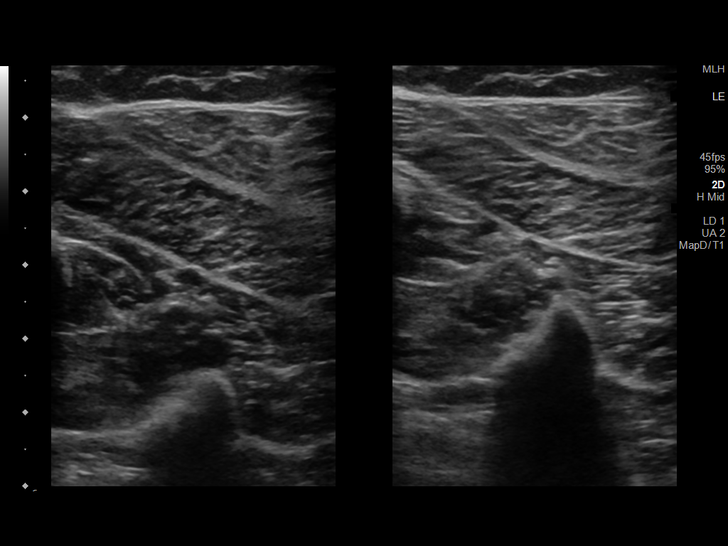
[im 26/32]
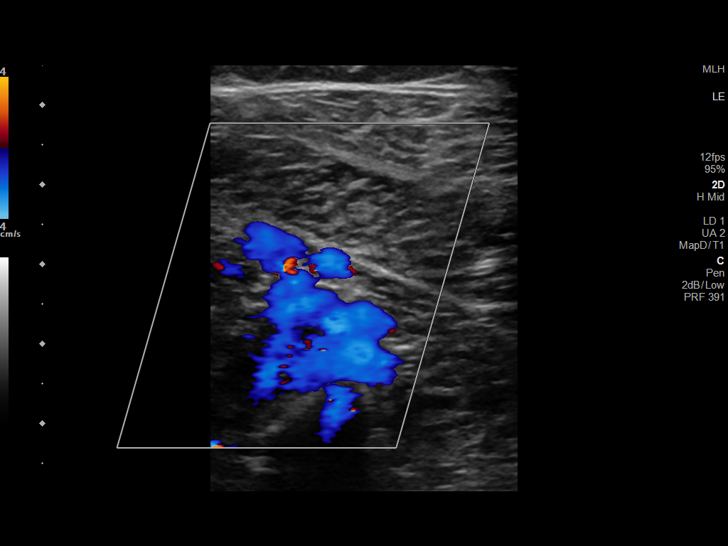
[im 29/32]
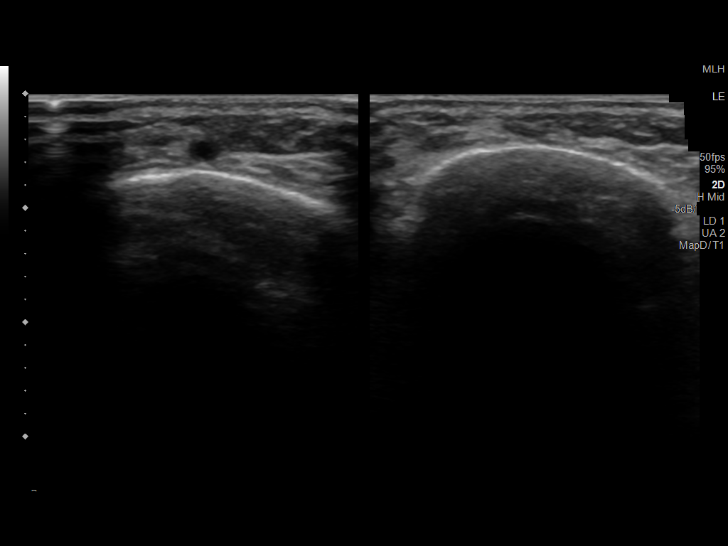
[im 32/32]
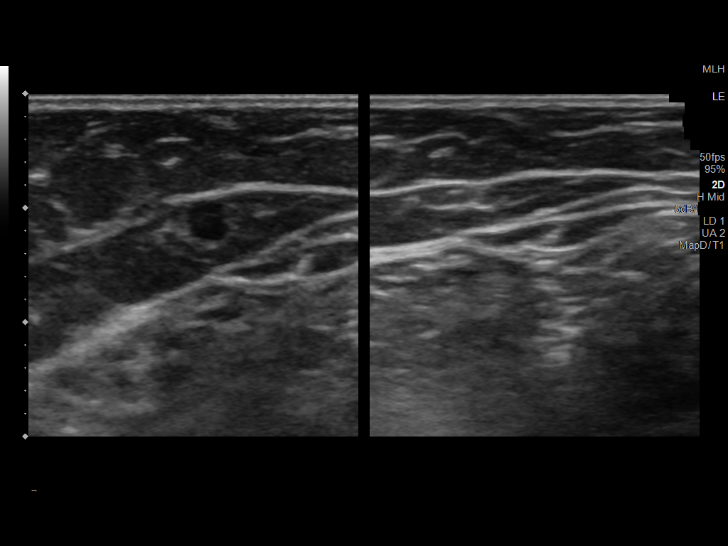

[14 of 24 positions shown; findings below may reference images not displayed]

FINDINGS: VENOUS

Normal compressibility of the common femoral, superficial femoral,
and popliteal veins, as well as the visualized calf veins.
Visualized portions of profunda femoral vein and great saphenous
vein unremarkable. No filling defects to suggest DVT on grayscale or
color Doppler imaging. Doppler waveforms show normal direction of
venous flow, normal respiratory plasticity and response to
augmentation.

Limited views of the contralateral common femoral vein are
unremarkable.

OTHER

None.
IMPRESSION: Negative.

## 2022-05-11 ENCOUNTER — Other Ambulatory Visit: Payer: Self-pay | Admitting: Family Medicine

## 2022-05-11 ENCOUNTER — Ambulatory Visit
Admission: RE | Admit: 2022-05-11 | Discharge: 2022-05-11 | Disposition: A | Discharge status: Home / Self Care | Payer: 59 | Source: Ambulatory Visit | Attending: Family Medicine | Admitting: Family Medicine

## 2022-05-11 DIAGNOSIS — G44059 Short lasting unilateral neuralgiform headache with conjunctival injection and tearing (SUNCT), not intractable: Secondary | ICD-10-CM

## 2022-07-16 DIAGNOSIS — Z8619 Personal history of other infectious and parasitic diseases: Secondary | ICD-10-CM

## 2022-07-16 HISTORY — DX: Personal history of other infectious and parasitic diseases: Z86.19

## 2022-07-16 HISTORY — PX: COLONOSCOPY WITH ESOPHAGOGASTRODUODENOSCOPY (EGD): SHX5779

## 2023-04-24 ENCOUNTER — Encounter (HOSPITAL_BASED_OUTPATIENT_CLINIC_OR_DEPARTMENT_OTHER): Payer: Self-pay

## 2023-04-24 ENCOUNTER — Emergency Department (HOSPITAL_BASED_OUTPATIENT_CLINIC_OR_DEPARTMENT_OTHER): Payer: 59

## 2023-04-24 ENCOUNTER — Emergency Department (HOSPITAL_BASED_OUTPATIENT_CLINIC_OR_DEPARTMENT_OTHER)
Admission: EM | Admit: 2023-04-24 | Discharge: 2023-04-24 | Disposition: A | Discharge status: Home / Self Care | Payer: 59 | Attending: Emergency Medicine | Admitting: Emergency Medicine

## 2023-04-24 ENCOUNTER — Other Ambulatory Visit: Payer: Self-pay

## 2023-04-24 DIAGNOSIS — R0789 Other chest pain: Secondary | ICD-10-CM | POA: Insufficient documentation

## 2023-04-24 DIAGNOSIS — M62830 Muscle spasm of back: Secondary | ICD-10-CM | POA: Diagnosis present

## 2023-04-24 LAB — CBC WITH DIFFERENTIAL/PLATELET
Abs Immature Granulocytes: 0.01 10*3/uL (ref 0.00–0.07)
Basophils Absolute: 0 10*3/uL (ref 0.0–0.1)
Basophils Relative: 1 %
Eosinophils Absolute: 0.3 10*3/uL (ref 0.0–0.5)
Eosinophils Relative: 4 %
HCT: 38.9 % (ref 36.0–46.0)
Hemoglobin: 12.4 g/dL (ref 12.0–15.0)
Immature Granulocytes: 0 %
Lymphocytes Relative: 30 %
Lymphs Abs: 1.8 10*3/uL (ref 0.7–4.0)
MCH: 22.9 pg — ABNORMAL LOW (ref 26.0–34.0)
MCHC: 31.9 g/dL (ref 30.0–36.0)
MCV: 71.9 fL — ABNORMAL LOW (ref 80.0–100.0)
Monocytes Absolute: 0.4 10*3/uL (ref 0.1–1.0)
Monocytes Relative: 7 %
Neutro Abs: 3.5 10*3/uL (ref 1.7–7.7)
Neutrophils Relative %: 58 %
Platelets: 227 10*3/uL (ref 150–400)
RBC: 5.41 MIL/uL — ABNORMAL HIGH (ref 3.87–5.11)
RDW: 17.7 % — ABNORMAL HIGH (ref 11.5–15.5)
WBC: 6 10*3/uL (ref 4.0–10.5)
nRBC: 0 % (ref 0.0–0.2)

## 2023-04-24 LAB — I-STAT CHEM 8, ED
BUN: 13 mg/dL (ref 6–20)
Calcium, Ion: 1.06 mmol/L — ABNORMAL LOW (ref 1.15–1.40)
Chloride: 115 mmol/L — ABNORMAL HIGH (ref 98–111)
Creatinine, Ser: 0.5 mg/dL (ref 0.44–1.00)
Glucose, Bld: 100 mg/dL — ABNORMAL HIGH (ref 70–99)
HCT: 30 % — ABNORMAL LOW (ref 36.0–46.0)
Hemoglobin: 10.2 g/dL — ABNORMAL LOW (ref 12.0–15.0)
Potassium: 3.4 mmol/L — ABNORMAL LOW (ref 3.5–5.1)
Sodium: 143 mmol/L (ref 135–145)
TCO2: 17 mmol/L — ABNORMAL LOW (ref 22–32)

## 2023-04-24 LAB — TROPONIN I (HIGH SENSITIVITY)
Troponin I (High Sensitivity): <2 ng/L (ref <18)
Troponin I (High Sensitivity): <2 ng/L (ref <18)

## 2023-04-24 LAB — BASIC METABOLIC PANEL
Anion gap: 17 — ABNORMAL HIGH (ref 5–15)
BUN: 20 mg/dL (ref 6–20)
CO2: 18 mmol/L — ABNORMAL LOW (ref 22–32)
Calcium: 9.4 mg/dL (ref 8.9–10.3)
Chloride: 106 mmol/L (ref 98–111)
Creatinine, Ser: 0.79 mg/dL (ref 0.44–1.00)
GFR, Estimated: >60 mL/min (ref >60)
Glucose, Bld: 103 mg/dL — ABNORMAL HIGH (ref 70–99)
Potassium: 3.9 mmol/L (ref 3.5–5.1)
Sodium: 141 mmol/L (ref 135–145)

## 2023-04-24 LAB — HCG, SERUM, QUALITATIVE: Preg, Serum: NEGATIVE

## 2023-04-24 MED ORDER — DICLOFENAC SODIUM 1 % EX GEL: 2.0000 g | Freq: Four times a day (QID) | CUTANEOUS | 0 refills | Status: DC | PRN

## 2023-04-24 MED ORDER — IOHEXOL 350 MG/ML SOLN
100.0000 mL | Freq: Once | INTRAVENOUS | Status: AC | PRN
  Administered 2023-04-24: 100 mL via INTRAVENOUS

## 2023-04-24 MED ORDER — TIZANIDINE HCL 4 MG PO TABS: 4.0000 mg | ORAL_TABLET | Freq: Four times a day (QID) | ORAL | 0 refills | Status: DC | PRN

## 2023-04-24 MED ORDER — KETOROLAC TROMETHAMINE 30 MG/ML IJ SOLN
30.0000 mg | Freq: Once | INTRAMUSCULAR | Status: DC
  Filled 2023-04-24: qty 1

## 2023-04-24 NOTE — ED Notes (Signed)

## 2023-04-24 NOTE — ED Notes (Signed)
Patient declined medication and asked to wait until after her CT results.

## 2023-04-24 NOTE — ED Provider Notes (Signed)
[facility]  Provider Note  CSN: [id number] Arrival date & time: 04/24/23 0601  History Chief Complaint  Patient presents with   Back Pain    Chelsey Hess is a 47 y.o. female with history of Protein C deficiency and prior DVT PE, not currently on anticoagulation complicated by several spontaneous miscarriages a few years ago. Today she reports several days of lower back pain, she initially thought was due to the chair she sits on at work but has spread to her upper back and is worse with deep breath and similar in nature to prior PE. She denies any CP, some SOB. No cough or fever. No N/V/D or dysuria. She has not taken anything for the pain.    Home Medications Prior to Admission medications   Medication Sig Start Date End Date Taking? Authorizing Provider  diclofenac Sodium (VOLTAREN) 1 % GEL Apply 2 g topically 4 (four) times daily as needed. 04/24/23  Yes Long, Arlyss Repress, MD  tiZANidine (ZANAFLEX) 4 MG tablet Take 1 tablet (4 mg total) by mouth every 6 (six) hours as needed for muscle spasms. 04/24/23  Yes Long, Arlyss Repress, MD     Allergies    Patient has no known allergies.   Review of Systems   Review of Systems Please see HPI for pertinent positives and negatives  Physical Exam BP 114/75   Pulse 83   Temp 98.2 F (36.8 C)   Resp 15   Ht 5\' 8"  (1.727 m)   Wt 74 kg   LMP 03/26/2023   SpO2 100%   BMI 24.81 kg/m   Physical Exam Vitals and nursing note reviewed.  Constitutional:      Appearance: Normal appearance.  HENT:     Head: Normocephalic and atraumatic.     Nose: Nose normal.     Mouth/Throat:     Mouth: Mucous membranes are moist.  Eyes:     Extraocular Movements: Extraocular movements intact.     Conjunctiva/sclera: Conjunctivae normal.  Cardiovascular:     Rate and Rhythm: Normal rate.  Pulmonary:     Effort: Pulmonary effort is normal.     Breath sounds: Normal breath sounds.  Abdominal:      General: Abdomen is flat.     Palpations: Abdomen is soft.     Tenderness: There is no abdominal tenderness.  Musculoskeletal:        General: Tenderness (L lower thoracic/upper lumbar area) present. No swelling. Normal range of motion.     Cervical back: Neck supple.     Right lower leg: No edema.     Left lower leg: No edema.  Skin:    General: Skin is warm and dry.  Neurological:     General: No focal deficit present.     Mental Status: She is alert.  Psychiatric:        Mood and Affect: Mood normal.     ED Results / Procedures / Treatments   EKG EKG Interpretation Date/Time:  Wednesday April 24 2023 06:30:10 EDT Ventricular Rate:  83 PR Interval:  115 QRS Duration:  92 QT Interval:  366 QTC Calculation: 430 R Axis:   60  Text Interpretation: Sinus rhythm Borderline short PR interval Abnormal R-wave progression, early transition No significant change since last tracing Confirmed by Susy Frizzle [phone]) on 04/24/2023 6:33:03 AM  Procedures Procedures  Medications Ordered in the ED Medications  iohexol (OMNIPAQUE) 350 MG/ML injection 100 mL (100 mLs Intravenous  Contrast Given 04/24/23 1003)    Initial Impression and Plan  Patient here with back pain, likely MSK but given her history of DVT/PE and similar pain with prior PE, will check labs and send for CT. Toradol for pain in the meantime. She is hemodynamically stable now.   ED Course   Clinical Course as of 04/26/23 2319  Wed Apr 24, 2023  0642 CBC is unremarkable.  [CS]  0655 HCG is neg.  [CS]    Clinical Course User Index [CS] Pollyann Savoy, MD     MDM Rules/Calculators/A&P Medical Decision Making Problems Addressed: Atypical chest pain: acute illness or injury Muscle spasm of back: acute illness or injury  Amount and/or Complexity of Data Reviewed Labs: ordered. Decision-making details documented in ED Course. Radiology: ordered. ECG/medicine tests: ordered and independent interpretation  performed. Decision-making details documented in ED Course.  Risk Prescription drug management.     Final Clinical Impression(s) / ED Diagnoses Final diagnoses:  Muscle spasm of back  Atypical chest pain    Rx / DC Orders ED Discharge Orders          Ordered    tiZANidine (ZANAFLEX) 4 MG tablet  Every 6 hours PRN        04/24/23 1139    diclofenac Sodium (VOLTAREN) 1 % GEL  4 times daily PRN        04/24/23 1139             Pollyann Savoy, MD 04/26/23 2319

## 2023-04-24 NOTE — ED Triage Notes (Addendum)
Patient reports back pain that has gotten worse over the last few days. Patient states she has not taken any medication for the pain. Patient reports she had a blood clot in her arms and lungs 3 years ago and this feels similar. A that time she thought it was related to her pregnancy. Pain is rated 7/10.

## 2023-04-24 NOTE — Discharge Instructions (Signed)

## 2023-04-24 NOTE — ED Notes (Signed)
Pt needed significant amount of reassurance about receiving a CT with contrast. Pt thought the contrast would make her have an allergic reaction, and requested an XR. Pt was informed that for her complaint, the CT was the most appropriate imaging, and she agreed to the procedure.

## 2023-04-24 NOTE — ED Provider Notes (Signed)
Blood pressure 121/66, pulse 71, temperature 98.7 F (37.1 C), temperature source Oral, resp. rate 13, height 5\' 8"  (1.727 m), weight 74 kg, last menstrual period 03/26/2023, SpO2 100%, unknown if currently breastfeeding.  Assuming care from Dr. Bernette Mayers.  In short, Chelsey Hess is a 47 y.o. female with a chief complaint of Back Pain .  Refer to the original H&P for additional details.  The current plan of care is to follow up on CTA and reassess.  CTA independently interpreted and agree with radiology assessment. Plan for MSK relaxing medications. Discussed drowsy side effects and to not drive while taking these medications. Discussed strict ED return precautions.    Maia Plan, MD 04/24/23 [phone]

## 2023-04-24 NOTE — ED Notes (Signed)
Pt advised she wants to wait for the results before taking meds. Wants a general diagnosis first.

## 2023-04-30 ENCOUNTER — Ambulatory Visit (INDEPENDENT_AMBULATORY_CARE_PROVIDER_SITE_OTHER): Payer: 59 | Admitting: Internal Medicine

## 2023-04-30 ENCOUNTER — Encounter: Payer: Self-pay | Admitting: Internal Medicine

## 2023-04-30 ENCOUNTER — Other Ambulatory Visit: Payer: Self-pay

## 2023-04-30 VITALS — BP 105/70 | HR 81 | Temp 97.7°F | Ht 68.0 in | Wt 168.0 lb

## 2023-04-30 DIAGNOSIS — K2970 Gastritis, unspecified, without bleeding: Secondary | ICD-10-CM | POA: Diagnosis not present

## 2023-04-30 DIAGNOSIS — B9681 Helicobacter pylori [H. pylori] as the cause of diseases classified elsewhere: Secondary | ICD-10-CM | POA: Insufficient documentation

## 2023-04-30 MED ORDER — PANTOPRAZOLE SODIUM 20 MG PO TBEC: 20.0000 mg | DELAYED_RELEASE_TABLET | Freq: Every day | ORAL | 0 refills | Status: DC

## 2023-04-30 MED ORDER — BISMUTH/METRONIDAZ/TETRACYCLIN 140-125-125 MG PO CAPS: 1.0000 | ORAL_CAPSULE | Freq: Four times a day (QID) | ORAL | 0 refills | Status: DC

## 2023-04-30 NOTE — Progress Notes (Signed)
[facility]      Reason for Consult: H pylori infection    Referring Physician: Dr. Dulce Sellar    Patient ID: Chelsey Hess, female    DOB: October 21, 1975, 47 y.o.   MRN: [id number]  HPI:   Chelsey Hess is here for evaluation of H pylori infection. She has had longstanding abdominal discomfort and she was found to have H pylori infection during EGD with gastritis.  She was initially started on treatment in February with metronidazole, doxycycline, Bismuth and pantoprazole but took for a very short time and reported symptoms of dizziness, bone pain in her arms and some nausea.  She was then given a rifabutin regimen with amoxicilline and omeprazole and reported similar symptoms and stopped.  Then in September with a levaquin based regimen and again stopped the medication.  No rash, no hives or other allergic symptoms.  She wants to try to combination pill, though she is not sure which one was previously prescribed.  She is willing to pay since insurance would not cover.     Past Medical History:  Diagnosis Date   DVT (deep vein thrombosis) in pregnancy    IUFD at 20 weeks or more of gestation 03/13/2020   Protein S deficiency [facility])     Prior to Admission medications   Medication Sig Start Date End Date Taking? Authorizing Provider  Bismuth/Metronidaz/Tetracyclin [facility]) [id number] MG CAPS Take 1 capsule by mouth 4 (four) times daily. 04/30/23  Yes Cherisa Brucker, Belia Heman, MD  pantoprazole (PROTONIX) 20 MG tablet Take 1 tablet (20 mg total) by mouth daily. 04/30/23  Yes Vici Novick, Belia Heman, MD  diclofenac Sodium (VOLTAREN) 1 % GEL Apply 2 g topically 4 (four) times daily as needed. Patient not taking: Reported on 04/30/2023 04/24/23   Long, Arlyss Repress, MD  tiZANidine (ZANAFLEX) 4 MG tablet Take 1 tablet (4 mg total) by mouth every 6 (six) hours as needed for muscle spasms. Patient not taking: Reported on 04/30/2023 04/24/23   Long, Arlyss Repress, MD    No Known Allergies  Social History    Tobacco Use   Smoking status: Never   Smokeless tobacco: Never  Vaping Use   Vaping status: Never Used  Substance Use Topics   Alcohol use: Never   Drug use: Never    No family history on file.  Review of Systems  Gastrointestinal: positive for abdominal pain All other systems reviewed and are negative    Constitutional: in no apparent distress  Vitals:   04/30/23 0853  BP: 105/70  Pulse: 81  Temp: 97.7 F (36.5 C)  SpO2: 100%   EYES: anicteric Respiratory: normal respiratory effort  Labs: Lab Results  Component Value Date   WBC 6.0 04/24/2023   HGB 10.2 (L) 04/24/2023   HCT 30.0 (L) 04/24/2023   MCV 71.9 (L) 04/24/2023   PLT 227 04/24/2023    Lab Results  Component Value Date   CREATININE 0.50 04/24/2023   BUN 13 04/24/2023   NA 143 04/24/2023   K 3.4 (L) 04/24/2023   CL 115 (H) 04/24/2023   CO2 18 (L) 04/24/2023    Lab Results  Component Value Date   ALT 14 08/29/2020   AST 13 (L) 08/29/2020   ALKPHOS 40 08/29/2020   BILITOT 0.5 08/29/2020   INR 1.0 03/12/2020     Assessment: H pylori infection with gastritis.   I discussed treatment options with her and allergic symptoms.  From the history she feels some intolerance to the  medications and stops.  No allergic symptoms reported.  She has stopped multiple regimens.    She wants to try the 'combination' regimen, presumably Pylera so will prescribe that.  She understands it is the same medication.   If she again stops, not much else to offer until she decides to take something to completion.  I discussed the benefits of reduction of pain and cancer risk.   Plan: 1)  Pylera as a combination 2) follow up stool Ag 2 weeks after completion of treatment Follow up in 5 weeks or so

## 2023-05-30 ENCOUNTER — Encounter (HOSPITAL_BASED_OUTPATIENT_CLINIC_OR_DEPARTMENT_OTHER): Payer: Self-pay

## 2023-05-30 ENCOUNTER — Ambulatory Visit (HOSPITAL_BASED_OUTPATIENT_CLINIC_OR_DEPARTMENT_OTHER): Admit: 2023-05-30 | Payer: 59 | Admitting: Obstetrics and Gynecology

## 2023-05-30 SURGERY — HYSTERECTOMY, SUPRACERVICAL, LAPAROSCOPIC
Anesthesia: General

## 2023-06-06 ENCOUNTER — Ambulatory Visit: Payer: 59 | Admitting: Internal Medicine

## 2023-10-28 ENCOUNTER — Telehealth: Payer: Self-pay

## 2023-10-28 NOTE — Telephone Encounter (Signed)
 Called pt in response to MyChart Appt Req, but no answer and left a VM. Also sent a MyChart msg requesting a call back as well.   Pt prev seen Dr. Comer and will need another provider assigned. At this time she does fall with the assignment with Dr. Zelda Hickman.

## 2024-01-13 ENCOUNTER — Inpatient Hospital Stay (HOSPITAL_BASED_OUTPATIENT_CLINIC_OR_DEPARTMENT_OTHER): Admitting: Family

## 2024-01-13 ENCOUNTER — Encounter: Payer: Self-pay | Admitting: Family

## 2024-01-13 ENCOUNTER — Inpatient Hospital Stay: Attending: Hematology & Oncology

## 2024-01-13 ENCOUNTER — Other Ambulatory Visit: Payer: Self-pay | Admitting: Family

## 2024-01-13 VITALS — BP 116/52 | HR 80 | Temp 97.5°F | Resp 17 | Wt 152.8 lb

## 2024-01-13 DIAGNOSIS — D509 Iron deficiency anemia, unspecified: Secondary | ICD-10-CM

## 2024-01-13 DIAGNOSIS — Z86718 Personal history of other venous thrombosis and embolism: Secondary | ICD-10-CM | POA: Insufficient documentation

## 2024-01-13 DIAGNOSIS — D500 Iron deficiency anemia secondary to blood loss (chronic): Secondary | ICD-10-CM | POA: Insufficient documentation

## 2024-01-13 DIAGNOSIS — N920 Excessive and frequent menstruation with regular cycle: Secondary | ICD-10-CM | POA: Insufficient documentation

## 2024-01-13 LAB — CMP (CANCER CENTER ONLY)
ALT: 9 U/L (ref 0–44)
AST: 10 U/L — ABNORMAL LOW (ref 15–41)
Albumin: 4.6 g/dL (ref 3.5–5.0)
Alkaline Phosphatase: 48 U/L (ref 38–126)
Anion gap: 8 (ref 5–15)
BUN: 20 mg/dL (ref 6–20)
CO2: 27 mmol/L (ref 22–32)
Calcium: 9.4 mg/dL (ref 8.9–10.3)
Chloride: 108 mmol/L (ref 98–111)
Creatinine: 0.65 mg/dL (ref 0.44–1.00)
GFR, Estimated: >60 mL/min (ref >60)
Glucose, Bld: 98 mg/dL (ref 70–99)
Potassium: 4.4 mmol/L (ref 3.5–5.1)
Sodium: 143 mmol/L (ref 135–145)
Total Bilirubin: 0.4 mg/dL (ref 0.0–1.2)
Total Protein: 7.1 g/dL (ref 6.5–8.1)

## 2024-01-13 LAB — IRON AND IRON BINDING CAPACITY (CC-WL,HP ONLY)
Iron: 12 ug/dL — ABNORMAL LOW (ref 28–170)
Saturation Ratios: 2 % — ABNORMAL LOW (ref 10.4–31.8)
TIBC: 545 ug/dL — ABNORMAL HIGH (ref 250–450)
UIBC: 533 ug/dL — ABNORMAL HIGH (ref 148–442)

## 2024-01-13 LAB — CBC WITH DIFFERENTIAL (CANCER CENTER ONLY)
Abs Immature Granulocytes: 0.01 10*3/uL (ref 0.00–0.07)
Basophils Absolute: 0.1 10*3/uL (ref 0.0–0.1)
Basophils Relative: 2 %
Eosinophils Absolute: 0.2 10*3/uL (ref 0.0–0.5)
Eosinophils Relative: 4 %
HCT: 30.7 % — ABNORMAL LOW (ref 36.0–46.0)
Hemoglobin: 8.5 g/dL — ABNORMAL LOW (ref 12.0–15.0)
Immature Granulocytes: 0 %
Lymphocytes Relative: 35 %
Lymphs Abs: 1.6 10*3/uL (ref 0.7–4.0)
MCH: 17.6 pg — ABNORMAL LOW (ref 26.0–34.0)
MCHC: 27.7 g/dL — ABNORMAL LOW (ref 30.0–36.0)
MCV: 63.4 fL — ABNORMAL LOW (ref 80.0–100.0)
Monocytes Absolute: 0.4 10*3/uL (ref 0.1–1.0)
Monocytes Relative: 10 %
Neutro Abs: 2.2 10*3/uL (ref 1.7–7.7)
Neutrophils Relative %: 49 %
Platelet Count: 313 10*3/uL (ref 150–400)
RBC: 4.84 MIL/uL (ref 3.87–5.11)
RDW: 26.1 % — ABNORMAL HIGH (ref 11.5–15.5)
WBC Count: 4.6 10*3/uL (ref 4.0–10.5)
nRBC: 0 % (ref 0.0–0.2)

## 2024-01-13 LAB — RETICULOCYTES
Immature Retic Fract: 35 % — ABNORMAL HIGH (ref 2.3–15.9)
RBC.: 4.86 MIL/uL (ref 3.87–5.11)
Retic Count, Absolute: 119.1 10*3/uL (ref 19.0–186.0)
Retic Ct Pct: 2.5 % (ref 0.4–3.1)

## 2024-01-13 LAB — FERRITIN: Ferritin: 10 ng/mL — ABNORMAL LOW (ref 11–307)

## 2024-01-13 MED ORDER — FERROUS SULFATE 325 (65 FE) MG PO TABS: 325.0000 mg | ORAL_TABLET | ORAL | 3 refills | Status: DC

## 2024-01-13 NOTE — Progress Notes (Unsigned)
 Hematology and Oncology Follow Up Visit  Chelsey Hess [id number] 1975-10-10 48 y.o. 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 She states that she has had this for many years but would always stop her supplement when her counts improved.  She states that her cycle is regular with very heavy flow and large clots. No other blood loss noted.  She had been scheduled for hysterectomy in 05/2023 but her surgeon had to cancel. She is waiting to reschedule.  No bruising or petechiae.  Her last miscarriage was in 2022.  No new thrombotic events reported.  She has noted fatigue and her hair thinning.  No fever, chills, n/v, cough, rash, dizziness, SOB, chest pain, palpitations, abdominal pain or changes in bowel or bladder habits.  She is tolerating her oral iron nicely. Script refilled today. She will continue taking 3 times a week.  No swelling, numbness or tingling in her extremities.  No falls or syncope.  Appetite and hydration are good. Weight is stable at 152 lbs.   ECOG Performance Status: 1 - Symptomatic but completely ambulatory  Medications:  Allergies as of 01/13/2024   No Known Allergies      Medication List        Accurate as of January 13, 2024 11:16 AM. If you have any questions, ask your nurse or doctor.          amoxicillin  250 MG capsule Commonly known as: AMOXIL  Take 750 mg by mouth 3 (three) times daily.   Bismuth /Metronidaz/Tetracyclin 140-125-125 MG Caps Commonly known as: Pylera Take 1 capsule by mouth 4 (four) times daily.   diclofenac  Sodium 1 % Gel Commonly known as: Voltaren  Apply 2 g topically  4 (four) times daily as needed.   pantoprazole  20 MG tablet Commonly known as: PROTONIX  Take 1 tablet (20 mg total) by mouth daily.   tiZANidine  4 MG tablet Commonly known as: Zanaflex  Take 1 tablet (4 mg total) by mouth every 6 (six) hours as needed for muscle spasms.        Allergies: No Known Allergies  Past Medical History, Surgical history, Social history, and Family History were reviewed and updated.  Review of Systems: All other 10 point review of systems is negative.   Physical Exam:  vitals were not taken for this visit.   Wt Readings from Last 3 Encounters:  04/30/23 168 lb (76.2 kg)  04/24/23 163 lb 2.3 oz (74 kg)  08/29/20 164 lb (74.4 kg)    Ocular: Sclerae unicteric, pupils equal, round and reactive to light Ear-nose-throat: Oropharynx clear, dentition fair Lymphatic: No cervical or supraclavicular adenopathy Lungs no rales or rhonchi, good excursion bilaterally Heart regular rate and rhythm, no murmur appreciated Abd soft, nontender, positive bowel sounds MSK no focal spinal tenderness, no joint edema Neuro: non-focal, well-oriented, appropriate affect Breasts: Deferred   Lab Results  Component Value Date   WBC 4.6 01/13/2024   HGB 8.5 (L) 01/13/2024   HCT 30.7 (L) 01/13/2024   MCV 63.4 (L) 01/13/2024   PLT 313 01/13/2024   Lab Results  Component Value Date   FERRITIN 18  12/01/2019   IRON 130 12/01/2019   TIBC 414 12/01/2019   UIBC 283 12/01/2019   IRONPCTSAT 32 12/01/2019   Lab Results  Component Value Date   RETICCTPCT 2.5 01/13/2024   RBC 4.84 01/13/2024   RBC 4.86 01/13/2024   No results found for: KPAFRELGTCHN, LAMBDASER, KAPLAMBRATIO No results found for: IGGSERUM, IGA, IGMSERUM No results found for: STEPHANY CARLOTA BENSON MARKEL EARLA JOANNIE DOC VICK, SPEI   Chemistry      Component Value Date/Time   NA 143 04/24/2023 0923   K 3.4 (L) 04/24/2023 0923   CL 115 (H) 04/24/2023 0923    CO2 18 (L) 04/24/2023 0627   BUN 13 04/24/2023 0923   CREATININE 0.50 04/24/2023 0923   CREATININE 0.70 08/29/2020 0743      Component Value Date/Time   CALCIUM 9.4 04/24/2023 0627   ALKPHOS 40 08/29/2020 0743   AST 13 (L) 08/29/2020 0743   ALT 14 08/29/2020 0743   BILITOT 0.5 08/29/2020 0743       Impression and Plan: Chelsey Hess is a very pleasant 48 yo [place] female with history of DVT in the left brachial veins (resolved on 12/18/2019 US ) and 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 No new miscarriage or thrombotic event reported. She is not currently on an anticoagulant or aspirin.  She now has persistent IDA secondary to heavy cycles. She is waiting to reschedule her hysterectomy.  We will get her set up for IV iron if needed.  She will continue her oral iron supplement 3 days a week.  Follow-up in 6 weeks.   Lauraine Pepper, NP 6/30/202511:16 AM

## 2024-01-14 ENCOUNTER — Encounter: Payer: Self-pay | Admitting: Family

## 2024-01-14 ENCOUNTER — Ambulatory Visit: Payer: Self-pay

## 2024-01-14 DIAGNOSIS — D500 Iron deficiency anemia secondary to blood loss (chronic): Secondary | ICD-10-CM | POA: Insufficient documentation

## 2024-01-21 ENCOUNTER — Inpatient Hospital Stay: Attending: Hematology & Oncology

## 2024-01-21 VITALS — BP 118/59 | HR 79 | Temp 98.0°F | Resp 17

## 2024-01-21 DIAGNOSIS — N920 Excessive and frequent menstruation with regular cycle: Secondary | ICD-10-CM | POA: Insufficient documentation

## 2024-01-21 DIAGNOSIS — D500 Iron deficiency anemia secondary to blood loss (chronic): Secondary | ICD-10-CM | POA: Diagnosis present

## 2024-01-21 MED ORDER — SODIUM CHLORIDE 0.9 % IV SOLN
300.0000 mg | Freq: Once | INTRAVENOUS | Status: AC
  Administered 2024-01-21: 300 mg via INTRAVENOUS
  Filled 2024-01-21: qty 300

## 2024-01-21 MED ORDER — SODIUM CHLORIDE 0.9 % IV SOLN: INTRAVENOUS | Status: DC

## 2024-01-21 NOTE — Patient Instructions (Signed)

## 2024-01-21 NOTE — Progress Notes (Signed)
 Tolerated Venofer  infusion welled. D/Ced home, steady gate noted

## 2024-01-27 ENCOUNTER — Inpatient Hospital Stay

## 2024-01-27 VITALS — BP 113/52 | HR 67 | Temp 98.0°F | Resp 18

## 2024-01-27 DIAGNOSIS — D500 Iron deficiency anemia secondary to blood loss (chronic): Secondary | ICD-10-CM | POA: Diagnosis not present

## 2024-01-27 MED ORDER — SODIUM CHLORIDE 0.9 % IV SOLN
300.0000 mg | Freq: Once | INTRAVENOUS | Status: AC
  Administered 2024-01-27: 300 mg via INTRAVENOUS
  Filled 2024-01-27: qty 300

## 2024-01-27 MED ORDER — SODIUM CHLORIDE 0.9 % IV SOLN: INTRAVENOUS | Status: DC

## 2024-01-27 NOTE — Patient Instructions (Addendum)
\  [id number]\\[id number]\Iron  Sucrose Injection What is this medication? IRON  SUCROSE (EYE ern SOO krose) treats low levels of iron  (iron  deficiency anemia) in people with kidney disease. Iron  is a mineral that plays an important role in making red blood cells, which carry oxygen from your lungs to the rest of your body. This medicine may be used for other purposes; ask your health care provider or pharmacist if you have questions. COMMON BRAND NAME(S): Venofer  What should I tell my care team before I take this medication? They need to know if you have any of these conditions: Anemia not caused by low iron  levels Heart disease High levels of iron  in the blood Kidney disease Liver disease An unusual or allergic reaction to iron , other medications, foods, dyes, or preservatives Pregnant or trying to get pregnant Breastfeeding How should I use this medication? This medication is infused into a vein. It is given by your care team in a hospital or clinic setting. Talk to your care team about the use of this medication in children. While it may be prescribed for children as young as 2 years for selected conditions, precautions do apply. Overdosage: If you think you have taken too much of this medicine contact a poison control center or emergency room at once. NOTE: This medicine is only for you. Do not share this medicine with others. What if I miss a dose? Keep appointments for follow-up doses. It is important not to miss your dose. Call your care team if you are unable to keep an appointment. What may interact with this medication? Do not take this medication with any of the following: Deferoxamine Dimercaprol Other iron  products This medication may also interact with the following: Chloramphenicol Deferasirox This list may not describe all possible interactions. Give your health care provider a list of all the medicines, herbs, non-prescription drugs, or dietary supplements you use.  Also tell them if you smoke, drink alcohol, or use illegal drugs. Some items may interact with your medicine. What should I watch for while using this medication? Visit your care team for regular checks on your progress. Tell your care team if your symptoms do not start to get better or if they get worse. You may need blood work done while you are taking this medication. You may need to eat more foods that contain iron . Talk to your care team. Foods that contain iron  include whole grains or cereals, dried fruits, beans, peas, leafy green vegetables, and organ meats (liver, kidney). What side effects may I notice from receiving this medication? Side effects that you should report to your care team as soon as possible: Allergic reactions--skin rash, itching, hives, swelling of the face, lips, tongue, or throat Low blood pressure--dizziness, feeling faint or lightheaded, blurry vision Shortness of breath Side effects that usually do not require medical attention (report to your care team if they continue or are bothersome): Flushing Headache Joint pain Muscle pain Nausea Pain, redness, or irritation at injection site This list may not describe all possible side effects. Call your doctor for medical advice about side effects. You may report side effects to FDA at 1-800-FDA-1088. Where should I keep my medication? This medication is given in a hospital or clinic. It will not be stored at home. NOTE: This sheet is a summary. It may not cover all possible information. If you have questions about this medicine, talk to your doctor, pharmacist, or health care provider.  2024 Elsevier/Gold Standard (2023-02-20 00:00:00)

## 2024-01-27 NOTE — Progress Notes (Signed)
Patient refused to wait 30 min post infusion. Released stable and ASX.

## 2024-01-28 ENCOUNTER — Inpatient Hospital Stay

## 2024-02-03 ENCOUNTER — Inpatient Hospital Stay

## 2024-02-03 VITALS — BP 112/50 | HR 79 | Temp 98.5°F | Resp 18

## 2024-02-03 DIAGNOSIS — D500 Iron deficiency anemia secondary to blood loss (chronic): Secondary | ICD-10-CM | POA: Diagnosis not present

## 2024-02-03 MED ORDER — SODIUM CHLORIDE 0.9 % IV SOLN
300.0000 mg | Freq: Once | INTRAVENOUS | Status: AC
  Administered 2024-02-03: 300 mg via INTRAVENOUS
  Filled 2024-02-03: qty 300

## 2024-02-03 MED ORDER — SODIUM CHLORIDE 0.9 % IV SOLN
INTRAVENOUS | Status: DC
Start: 2024-02-03 — End: 2024-02-03

## 2024-02-03 NOTE — Patient Instructions (Signed)

## 2024-02-03 NOTE — Progress Notes (Signed)
Patient refused to wait 30 minutes post infusion. Released stable and ASX. 

## 2024-02-04 ENCOUNTER — Inpatient Hospital Stay

## 2024-02-24 ENCOUNTER — Inpatient Hospital Stay: Attending: Hematology & Oncology

## 2024-02-24 ENCOUNTER — Inpatient Hospital Stay (HOSPITAL_BASED_OUTPATIENT_CLINIC_OR_DEPARTMENT_OTHER): Admitting: Family

## 2024-02-24 VITALS — BP 114/60 | HR 73 | Temp 98.7°F | Resp 18 | Ht 68.0 in | Wt 150.4 lb

## 2024-02-24 DIAGNOSIS — D500 Iron deficiency anemia secondary to blood loss (chronic): Secondary | ICD-10-CM | POA: Diagnosis not present

## 2024-02-24 DIAGNOSIS — N920 Excessive and frequent menstruation with regular cycle: Secondary | ICD-10-CM | POA: Insufficient documentation

## 2024-02-24 DIAGNOSIS — D6859 Other primary thrombophilia: Secondary | ICD-10-CM | POA: Insufficient documentation

## 2024-02-24 DIAGNOSIS — Z86718 Personal history of other venous thrombosis and embolism: Secondary | ICD-10-CM | POA: Diagnosis not present

## 2024-02-24 LAB — IRON AND IRON BINDING CAPACITY (CC-WL,HP ONLY)
Iron: 138 ug/dL (ref 28–170)
Saturation Ratios: 34 % — ABNORMAL HIGH (ref 10.4–31.8)
TIBC: 405 ug/dL (ref 250–450)
UIBC: 267 ug/dL

## 2024-02-24 LAB — CBC WITH DIFFERENTIAL (CANCER CENTER ONLY)
Abs Immature Granulocytes: 0.01 K/uL (ref 0.00–0.07)
Basophils Absolute: 0 K/uL (ref 0.0–0.1)
Basophils Relative: 1 %
Eosinophils Absolute: 0.2 K/uL (ref 0.0–0.5)
Eosinophils Relative: 5 %
HCT: 40.1 % (ref 36.0–46.0)
Hemoglobin: 12.2 g/dL (ref 12.0–15.0)
Immature Granulocytes: 0 %
Lymphocytes Relative: 38 %
Lymphs Abs: 1.7 K/uL (ref 0.7–4.0)
MCH: 22.6 pg — ABNORMAL LOW (ref 26.0–34.0)
MCHC: 30.4 g/dL (ref 30.0–36.0)
MCV: 74.1 fL — ABNORMAL LOW (ref 80.0–100.0)
Monocytes Absolute: 0.3 K/uL (ref 0.1–1.0)
Monocytes Relative: 7 %
Neutro Abs: 2.2 K/uL (ref 1.7–7.7)
Neutrophils Relative %: 49 %
Platelet Count: 190 K/uL (ref 150–400)
RBC: 5.41 MIL/uL — ABNORMAL HIGH (ref 3.87–5.11)
RDW: 25.9 % — ABNORMAL HIGH (ref 11.5–15.5)
WBC Count: 4.5 K/uL (ref 4.0–10.5)
nRBC: 0 % (ref 0.0–0.2)

## 2024-02-24 LAB — RETICULOCYTES
Immature Retic Fract: 3.5 % (ref 2.3–15.9)
RBC.: 5.45 MIL/uL — ABNORMAL HIGH (ref 3.87–5.11)
Retic Count, Absolute: 30.5 K/uL (ref 19.0–186.0)
Retic Ct Pct: 0.6 % (ref 0.4–3.1)

## 2024-02-24 LAB — FERRITIN: Ferritin: 108 ng/mL (ref 11–307)

## 2024-02-24 NOTE — Progress Notes (Signed)
 Hematology and Oncology Follow Up Visit  Chelsey Hess [id number] 1976/04/09 48 y.o. 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 She is doing well and feeling much better since receiving IV iron .  She is still taking her oral iron  supplement daily to help keep her counts up.  Her cycle finally stopped 10 days ago. She had been bleeding heavily for weeks up to that point.  No other blood loss noted. She is scheduled for hysterectomy on 9/11.  No abnormal bruising, no petechiae.  No fever, chills, n/v, cough, rash, dizziness, SOB, chest pain, palpitations, abdominal pain or changes in bowel or bladder habits.  No swelling, tenderness, numbness or tingling in her extremities.  No falls or syncope reported.  Appetite and hydration are good. Weight is stable at 150 lbs.   ECOG Performance Status: 0 - Asymptomatic  Medications:  Allergies as of 02/24/2024       Reactions   Metronidazole Other (See Comments)   Dizziness, headache, bone pain        Medication List        Accurate as of February 24, 2024  1:39 PM. If you have any questions, ask your nurse or doctor.          amoxicillin  250 MG capsule Commonly known as: AMOXIL  Take 750 mg by mouth 3 (three) times daily.   Bismuth /Metronidaz/Tetracyclin 140-125-125 MG Caps Commonly known as: Pylera Take 1 capsule by mouth 4 (four) times daily.   diclofenac  Sodium 1 % Gel Commonly known as: Voltaren  Apply 2 g topically 4 (four) times daily as needed.   ferrous sulfate  325 (65 FE) MG tablet Take 1 tablet (325 mg total) by mouth 3 (three) times a week.   pantoprazole  20 MG tablet Commonly known as:  PROTONIX  Take 1 tablet (20 mg total) by mouth daily.   tiZANidine  4 MG tablet Commonly known as: Zanaflex  Take 1 tablet (4 mg total) by mouth every 6 (six) hours as needed for muscle spasms.        Allergies:  Allergies  Allergen Reactions   Metronidazole Other (See Comments)    Dizziness, headache, bone pain    Past Medical History, Surgical history, Social history, and Family History were reviewed and updated.  Review of Systems: All other 10 point review of systems is negative.   Physical Exam:  vitals were not taken for this visit.   Wt Readings from Last 3 Encounters:  01/13/24 152 lb 12.8 oz (69.3 kg)  04/30/23 168 lb (76.2 kg)  04/24/23 163 lb 2.3 oz (74 kg)    Ocular: Sclerae unicteric, pupils equal, round and reactive to light Ear-nose-throat: Oropharynx clear, dentition fair Lymphatic: No cervical or supraclavicular adenopathy Lungs no rales or rhonchi, good excursion bilaterally Heart regular rate and rhythm, no murmur appreciated Abd soft, nontender, positive bowel sounds MSK no focal spinal tenderness, no joint edema Neuro: non-focal, well-oriented, appropriate affect Breasts: Deferred   Lab Results  Component Value Date   WBC 4.6 01/13/2024   HGB 8.5 (L) 01/13/2024   HCT 30.7 (L) 01/13/2024   MCV 63.4 (L) 01/13/2024   PLT 313 01/13/2024  Lab Results  Component Value Date   FERRITIN 10 (L) 01/13/2024   IRON  12 (L) 01/13/2024   TIBC 545 (H) 01/13/2024   UIBC 533 (H) 01/13/2024   IRONPCTSAT 2 (L) 01/13/2024   Lab Results  Component Value Date   RETICCTPCT 0.6 02/24/2024   RBC 5.45 (H) 02/24/2024   No results found for: KPAFRELGTCHN, LAMBDASER, KAPLAMBRATIO No results found for: IGGSERUM, IGA, IGMSERUM No results found for: STEPHANY CARLOTA BENSON MARKEL EARLA JOANNIE DOC VICK, SPEI   Chemistry      Component Value Date/Time   NA 143 01/13/2024 1050   K 4.4 01/13/2024 1050   CL 108 01/13/2024  1050   CO2 27 01/13/2024 1050   BUN 20 01/13/2024 1050   CREATININE 0.65 01/13/2024 1050      Component Value Date/Time   CALCIUM 9.4 01/13/2024 1050   ALKPHOS 48 01/13/2024 1050   AST 10 (L) 01/13/2024 1050   ALT 9 01/13/2024 1050   BILITOT 0.4 01/13/2024 1050       Impression and Plan: Chelsey Hess is a very pleasant 48 yo [place] female with history of DVT in the left brachial veins (resolved on 12/18/2019 US ) and 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 No new miscarriage or thrombotic event reported. She is not currently on an anticoagulant or aspirin.  She now has persistent IDA secondary to heavy cycles. Hysterectomy is scheduled for 03/26/2024.  I discussed her upcoming surgery with Dr. Timmy. No need for anticoagulation prior to or after surgery indicated.   Iron  studies are pending. We will replace if needed.  Follow-up in 3 months.   Lauraine Pepper, NP 8/11/20251:39 PM

## 2024-02-25 ENCOUNTER — Ambulatory Visit: Payer: Self-pay

## 2024-03-20 ENCOUNTER — Encounter (HOSPITAL_COMMUNITY): Payer: Self-pay | Admitting: Obstetrics and Gynecology

## 2024-03-20 NOTE — Progress Notes (Signed)
 Spoke w/ via phone for pre-op interview--- pt Lab needs dos----   upt      Lab results------ lab appt 03-24-2024 @ 0900 getting CBc/ T&S COVID test -----patient states asymptomatic no test needed Arrive at -------  0530 on 03-26-2024 NPO after MN w/ exception sips of water w/ meds Pre-Surgery Ensure or G2:  n/a  Med rec completed Medications to take morning of surgery ----- none Diabetic medication -----  n/a  GLP1 agonist last dose:  n/a GLP1 instructions:  Patient instructed no nail polish to be worn day of surgery Patient instructed to bring photo id and insurance card day of surgery Patient aware to have Driver (ride ) / caregiver    for 24 hours after surgery - husband, Chelsey Hess Patient Special Instructions ----- will pick up soap and written instructions at lab appt Pre-Op special Instructions -----  n/a  Patient verbalized understanding of instructions that were given at this phone interview. Patient denies chest pain, sob, fever, cough at the interview.

## 2024-03-20 NOTE — Pre-Procedure Instructions (Signed)
 Surgical Instructions   Your procedure is scheduled on :  Thursday,  03-26-2024. Report to [facility] Main Entrance A at 5:30  A.M., then check in with the Admitting office. Any questions or running late day of surgery: call [phone]  Questions prior to your surgery date: call [phone], Monday-Friday, 8am-4pm. If you experience any cold or flu symptoms such as cough, fever, chills, shortness of breath, etc. between now and your scheduled surgery, please notify your surgeon office.    Remember:  Do not eat any food and do not drink any liquids after midnight the night before your surgery.  This includes no water,  candy,  gum,  and  mints.    Take these medicines the morning of surgery with A SIP OF WATER :  NONE   May take these medicines IF NEEDED:  NONE    One week prior to surgery, STOP taking any Aspirin (unless otherwise instructed by your surgeon) Aleve, Naproxen, Ibuprofen , Motrin , Advil , Goody's, BC's, all herbal medications, fish oil, and non-prescription vitamins.                     Do NOT Smoke (Tobacco/Vaping) and Do Not drink alcohol for 24 hours prior to your procedure.  If you use a CPAP at night, you may bring your mask/headgear for your overnight stay.   You will be asked to remove any contacts, glasses, piercing's, hearing aid's, dentures/partials prior to surgery. Please bring cases / contact solution/ etc., for these items day of surgery.   Patients discharged the day of surgery will not be allowed to drive home, and someone needs to stay with them for 24 hours.  SURGICAL WAITING ROOM VISITATION Patients may have no more than 2 support people in the waiting area - these visitors may rotate.   Pre-op nurse will coordinate an appropriate time for 1 ADULT support person, who may not rotate, to accompany patient in pre-op.  Children under the age of 80 must have an adult with them who is not the patient and must remain in the main waiting area with an  adult.  If the patient needs to stay at the hospital during part of their recovery, the visitor guidelines for inpatient rooms apply.  Please refer to the [facility] website for the visitor guidelines for any additional information.   If you received a COVID test during your pre-op visit  it is requested that you wear a mask when out in public, stay away from anyone that may not be feeling well and notify your surgeon if you develop symptoms. If you have been in contact with anyone that has tested positive in the last 10 days please notify you surgeon.      Pre-operative CHG Bathing Instructions   You can play a key role in reducing the risk of infection after surgery. Your skin needs to be as free of germs as possible. You can reduce the number of germs on your skin by washing with CHG (chlorhexidine gluconate) soap before surgery. CHG is an antiseptic soap that kills germs and continues to kill germs even after washing.   DO NOT use if you have an allergy to chlorhexidine/CHG or antibacterial soaps. If your skin becomes reddened or irritated, stop using the CHG and notify one of our RN day of surgery.             TAKE A SHOWER THE NIGHT BEFORE SURGERY AND THE DAY OF SURGERY    Please keep  in mind the following:  DO NOT shave, including legs and genital area, 48 hours prior to surgery.   You may shave your face before/day of surgery.  Place clean sheets on your bed the night before surgery Use a clean washcloth (not used since being washed) for each shower. DO NOT sleep with pet's night before surgery.  CHG Shower Instructions:  Wash your face and private area with normal soap. If you choose to wash your hair, wash first with your normal shampoo.  After you use shampoo/soap, rinse your hair and body thoroughly to remove shampoo/soap residue.  Turn the water OFF and apply half the bottle of CHG soap to a CLEAN washcloth.  Apply CHG soap ONLY FROM YOUR NECK DOWN TO YOUR TOES (washing  for 3-5 minutes)  DO NOT use CHG soap on face, private areas, open wounds, or sores.  Pay special attention to the area where your surgery is being performed.  If you are having back surgery, having someone wash your back for you may be helpful. Wait 2 minutes after CHG soap is applied, then you may rinse off the CHG soap.  Pat dry with a clean towel  Put on clean pajamas    Additional instructions for the day of surgery: DO NOT APPLY any lotions,  powder,  oils,  deodorants (may use underarm deodorant) , cologne/  perfumes  or makeup Do not wear jewelry /  piercing's/  metal/  permanent jewelry must be removed prior to arrival day of surgery.  (No plastic piercing) Do not wear nail polish, gel polish, artificial nails, or any other type of covering on natural finger nails (toe nails are okay) Do not bring valuables to the hospital. [facility] is not responsible for valuables/personal belongings. Put on clean/comfortable clothes.  Please brush your teeth and rinse your mouth out. Ask your nurse before applying any prescription medications to the skin.

## 2024-03-24 ENCOUNTER — Encounter (HOSPITAL_COMMUNITY)
Admission: RE | Admit: 2024-03-24 | Discharge: 2024-03-24 | Disposition: A | Discharge status: Home / Self Care | Source: Ambulatory Visit | Attending: Obstetrics and Gynecology | Admitting: Obstetrics and Gynecology

## 2024-03-24 DIAGNOSIS — D500 Iron deficiency anemia secondary to blood loss (chronic): Secondary | ICD-10-CM | POA: Diagnosis not present

## 2024-03-24 DIAGNOSIS — Z01812 Encounter for preprocedural laboratory examination: Secondary | ICD-10-CM | POA: Insufficient documentation

## 2024-03-24 DIAGNOSIS — Z01818 Encounter for other preprocedural examination: Secondary | ICD-10-CM | POA: Diagnosis present

## 2024-03-24 LAB — TYPE AND SCREEN
ABO/RH(D): B POS
Antibody Screen: NEGATIVE

## 2024-03-24 LAB — CBC
HCT: 39.2 % (ref 36.0–46.0)
Hemoglobin: 12.5 g/dL (ref 12.0–15.0)
MCH: 24.3 pg — ABNORMAL LOW (ref 26.0–34.0)
MCHC: 31.9 g/dL (ref 30.0–36.0)
MCV: 76.3 fL — ABNORMAL LOW (ref 80.0–100.0)
Platelets: 184 K/uL (ref 150–400)
RBC: 5.14 MIL/uL — ABNORMAL HIGH (ref 3.87–5.11)
RDW: 21.6 % — ABNORMAL HIGH (ref 11.5–15.5)
WBC: 4.9 K/uL (ref 4.0–10.5)
nRBC: 0 % (ref 0.0–0.2)

## 2024-03-25 NOTE — Anesthesia Preprocedure Evaluation (Signed)
 Anesthesia Evaluation  Patient identified by MRN, date of birth, ID band Patient awake    Reviewed: Allergy & Precautions, NPO status , Patient's Chart, lab work & pertinent test results  Airway Mallampati: I  TM Distance: >3 FB Neck ROM: Full    Dental no notable dental hx. (+) Teeth Intact, Dental Advisory Given   Pulmonary neg pulmonary ROS   Pulmonary exam normal breath sounds clear to auscultation       Cardiovascular + DVT  Normal cardiovascular exam Rhythm:Regular Rate:Normal     Neuro/Psych negative neurological ROS  negative psych ROS   GI/Hepatic Neg liver ROS, PUD,,,  Endo/Other  negative endocrine ROS    Renal/GU negative Renal ROS  negative genitourinary   Musculoskeletal negative musculoskeletal ROS (+)    Abdominal   Peds  Hematology negative hematology ROS (+) Protein S deficiency   Anesthesia Other Findings   Reproductive/Obstetrics                              Anesthesia Physical Anesthesia Plan  ASA: 2  Anesthesia Plan: General   Post-op Pain Management: Tylenol  PO (pre-op)*, Ketamine  IV* and Dilaudid  IV   Induction: Intravenous  PONV Risk Score and Plan: 3 and Midazolam , Dexamethasone  and Ondansetron   Airway Management Planned: Oral ETT  Additional Equipment:   Intra-op Plan:   Post-operative Plan: Extubation in OR  Informed Consent: I have reviewed the patients History and Physical, chart, labs and discussed the procedure including the risks, benefits and alternatives for the proposed anesthesia with the patient or authorized representative who has indicated his/her understanding and acceptance.     Dental advisory given  Plan Discussed with: CRNA  Anesthesia Plan Comments: (2 IVs)         Anesthesia Quick Evaluation

## 2024-03-25 NOTE — H&P (Signed)
 Chelsey Hess is an 48 y.o. female presenting for scheduled procedure  Pertinent Gynecological History: Menses: with severe dysmenorrhea Bleeding: dysfunctional uterine bleeding Contraception: condoms DES exposure: denies Blood transfusions: none Sexually transmitted diseases: no past history Previous GYN Procedures: DNC  Last mammogram: normal Date: 06/2021 Last pap: normal Date: 05/2023 OB History: G4, P2134, h/o csx for twins then repeat section. Fetal demise at 28wks SVD 2021. H/o hysteroscopic fibroid resection   Menstrual History: Menarche age: early teens Patient's last menstrual period was 03/19/2024 (exact date).    Past Medical History:  Diagnosis Date   Abnormal uterine bleeding (AUB)    History of DVT (deep vein thrombosis) 10/30/2019   during pregency--- left brachial veins;  ultrasound 12-18-2019  resolved,  work-up found ,  protein S def   History of fetal demise, not currently pregnant 03/13/2020   IUFD  at 28 weeks   History of Helicobacter pylori infection 07/2022   treated   Iron  deficiency anemia due to chronic blood loss    hematologist---  Chelsey pepper NP   Leiomyoma of uterus    Protein S deficiency [facility])    hematology--- Dr Chelsey Hess    Past Surgical History:  Procedure Laterality Date   CESAREAN SECTION  02/04/2001   COLONOSCOPY WITH ESOPHAGOGASTRODUODENOSCOPY (EGD)  07/2022   dr burnette   DILATATION & CURETTAGE/HYSTEROSCOPY WITH MYOSURE N/A 08/29/2018   Procedure: DILATATION & CURETTAGE/HYSTEROSCOPY WITH MYOSURE;  Surgeon: Chelsey Chick, MD;  Location: [facility];  Service: Gynecology;  Laterality: N/A;   LAPAROSCOPIC APPENDECTOMY  1998   REPEAT CESAREAN SECTION  06/10/2003    History reviewed. No pertinent family history.  Social History:  reports that she has never smoked. She has never used smokeless tobacco. She reports that she does not drink alcohol and does not use drugs.  Allergies:  Allergies  Allergen Reactions    Metronidazole Other (See Comments)    Dizziness, headache, bone pain    No medications prior to admission.    Review of Systems  Constitutional:  Negative for chills and fever.  Respiratory:  Negative for shortness of breath.   Cardiovascular:  Negative for chest pain, palpitations and leg swelling.  Gastrointestinal:  Negative for abdominal pain, nausea and vomiting.  Genitourinary:  Positive for menstrual problem and pelvic pain.  Neurological:  Negative for dizziness, weakness and headaches.  Psychiatric/Behavioral:  Negative for suicidal ideas.     Height 5' 8 (1.727 m), weight 68 kg, last menstrual period 03/19/2024, unknown if currently breastfeeding. Physical Exam Chaperone Chaperone: present  Constitutional *General Appearance: healthy-appearing, well-nourished  Abdomen *Inspection/Palpation/Auscultation: non-distended, no tenderness, no rebound, no guarding, soft, abdomen: incision Pfannenstiel  Female Genitalia (from preop appointment) Vulva: no masses, no atrophy, no lesions Mons: normal, no erythema, no tenderness Labia Majora: normal, no erythema, no excoriation, no lesions, no vesicles/ ulcers, no masses Labia Minora: normal Introitus: normal *Vagina: no discharge, no erythema, no lesions, no ulcers, no tenderness, vaginal tone normal *Cervix: grossly normal, no lesions, no cervical motion tenderness *Uterus: mobile, non-tender, contour irregular, fibroids, enlarged 11 weeks size, anteverted *Bladder: non-tender *Adnexa/Parametria: no mass palpable, no tenderness  Rectum *Anus & Perineum: no anal fissure, no hemorrhoids *Digital Rectal: normal rectovaginal septum  Extremities Legs: normal Pulses: normal  Neurological System Impressions: motor: no deficits, sensory: no deficits  Psychiatric *Mood and Affect: active and alert, normal mood No results found for this or any previous visit (from the past 24 hours).  No results found. EMBx 8/25: SCANT  SUPERFICIAL  INACTIVE PATTERN ENDOMETRIUM NO EVIDENCE OF UNOPPOSED ESTROGEN EFFECT (HYPERPLASIA) OR MALIGNANCY TVUS 02/2024: Uterus 11.8x9.3x7.3cm, EMS 1.4cm. Small IM fibroid x2 (1cm and 1.5cm, continued evidence of adenomyosis with heterogenous, globular uterus and shadowing  Assessment/Plan: 55bn [id number] presenting for LAVH/BS/cysto. H/o csx x2, one for TIUP. PMHx s/f Protein S activity deficiency -TVUS and EMBx today -Risks of LSH include infection of the uterus, pelvic organs, or skin, inadvertent injury to internal organs, such as bowel or bladder. If there is major injury, extensive surgery may be required. If injury is minor, it may be treated with relative ease. Discussed possibility of excessive blood loss and transfusion. Patient aware that no future fertility will remain after procedure. Patient accepts the possibility of blood transfusion, if necessary. Bowel and/or bladder injury may require prolonged inpatient stay and possible colostomy, Foley catheter, etc, as deemed fit by other surgeon. Patient understands and agrees to move forward with surgery.  Chelsey Hess 03/25/2024, 1:43 PM

## 2024-03-26 ENCOUNTER — Other Ambulatory Visit: Payer: Self-pay

## 2024-03-26 ENCOUNTER — Ambulatory Visit (HOSPITAL_COMMUNITY): Admitting: Anesthesiology

## 2024-03-26 ENCOUNTER — Encounter (HOSPITAL_COMMUNITY)
Admission: RE | Disposition: A | Discharge status: Home / Self Care | Payer: Self-pay | Source: Home / Self Care | Attending: Obstetrics and Gynecology

## 2024-03-26 ENCOUNTER — Ambulatory Visit (HOSPITAL_BASED_OUTPATIENT_CLINIC_OR_DEPARTMENT_OTHER): Admitting: Anesthesiology

## 2024-03-26 ENCOUNTER — Ambulatory Visit (HOSPITAL_COMMUNITY)
Admission: RE | Admit: 2024-03-26 | Discharge: 2024-03-27 | Disposition: A | Discharge status: Home / Self Care | Attending: Obstetrics and Gynecology | Admitting: Obstetrics and Gynecology

## 2024-03-26 ENCOUNTER — Encounter (HOSPITAL_COMMUNITY): Payer: Self-pay | Admitting: Obstetrics and Gynecology

## 2024-03-26 DIAGNOSIS — Z86718 Personal history of other venous thrombosis and embolism: Secondary | ICD-10-CM | POA: Diagnosis not present

## 2024-03-26 DIAGNOSIS — N939 Abnormal uterine and vaginal bleeding, unspecified: Secondary | ICD-10-CM

## 2024-03-26 DIAGNOSIS — D6859 Other primary thrombophilia: Secondary | ICD-10-CM | POA: Diagnosis not present

## 2024-03-26 DIAGNOSIS — N838 Other noninflammatory disorders of ovary, fallopian tube and broad ligament: Secondary | ICD-10-CM | POA: Insufficient documentation

## 2024-03-26 DIAGNOSIS — K279 Peptic ulcer, site unspecified, unspecified as acute or chronic, without hemorrhage or perforation: Secondary | ICD-10-CM | POA: Insufficient documentation

## 2024-03-26 DIAGNOSIS — N920 Excessive and frequent menstruation with regular cycle: Secondary | ICD-10-CM | POA: Diagnosis not present

## 2024-03-26 DIAGNOSIS — N946 Dysmenorrhea, unspecified: Secondary | ICD-10-CM | POA: Diagnosis not present

## 2024-03-26 DIAGNOSIS — D500 Iron deficiency anemia secondary to blood loss (chronic): Secondary | ICD-10-CM

## 2024-03-26 DIAGNOSIS — N8003 Adenomyosis of the uterus: Secondary | ICD-10-CM | POA: Insufficient documentation

## 2024-03-26 DIAGNOSIS — Z01818 Encounter for other preprocedural examination: Secondary | ICD-10-CM

## 2024-03-26 DIAGNOSIS — D251 Intramural leiomyoma of uterus: Secondary | ICD-10-CM | POA: Diagnosis not present

## 2024-03-26 HISTORY — PX: CYSTOSCOPY: SHX5120

## 2024-03-26 HISTORY — DX: Iron deficiency anemia secondary to blood loss (chronic): D50.0

## 2024-03-26 HISTORY — DX: Abnormal uterine and vaginal bleeding, unspecified: N93.9

## 2024-03-26 HISTORY — DX: Leiomyoma of uterus, unspecified: D25.9

## 2024-03-26 HISTORY — PX: LAPAROSCOPIC SUPRACERVICAL HYSTERECTOMY: SHX5399

## 2024-03-26 LAB — POCT PREGNANCY, URINE: Preg Test, Ur: NEGATIVE

## 2024-03-26 SURGERY — HYSTERECTOMY, SUPRACERVICAL, LAPAROSCOPIC
Anesthesia: General

## 2024-03-26 MED ORDER — HYDROMORPHONE HCL 1 MG/ML IJ SOLN
INTRAMUSCULAR | Status: AC
  Filled 2024-03-26: qty 0.5

## 2024-03-26 MED ORDER — MAGNESIUM HYDROXIDE 400 MG/5ML PO SUSP: 30.0000 mL | Freq: Every day | ORAL | Status: DC | PRN

## 2024-03-26 MED ORDER — PHENYLEPHRINE HCL-NACL 20-0.9 MG/250ML-% IV SOLN
INTRAVENOUS | Status: DC | PRN
  Administered 2024-03-26: 20 ug/min via INTRAVENOUS

## 2024-03-26 MED ORDER — HYDROMORPHONE HCL 1 MG/ML IJ SOLN
INTRAMUSCULAR | Status: DC | PRN
  Administered 2024-03-26: .5 mg via INTRAVENOUS

## 2024-03-26 MED ORDER — ORAL CARE MOUTH RINSE: 15.0000 mL | Freq: Once | OROMUCOSAL | Status: AC

## 2024-03-26 MED ORDER — SODIUM CHLORIDE 0.9 % IR SOLN
Status: DC | PRN
  Administered 2024-03-26: 1000 mL

## 2024-03-26 MED ORDER — OXYCODONE HCL 5 MG PO TABS: 5.0000 mg | ORAL_TABLET | Freq: Four times a day (QID) | ORAL | 0 refills | Status: DC | PRN

## 2024-03-26 MED ORDER — LACTATED RINGERS IV SOLN: INTRAVENOUS | Status: DC

## 2024-03-26 MED ORDER — ALBUMIN HUMAN 5 % IV SOLN: INTRAVENOUS | Status: DC | PRN

## 2024-03-26 MED ORDER — KETOROLAC TROMETHAMINE 30 MG/ML IJ SOLN
30.0000 mg | Freq: Four times a day (QID) | INTRAMUSCULAR | Status: AC
  Administered 2024-03-26 – 2024-03-27 (×4): 30 mg via INTRAVENOUS
  Filled 2024-03-26 (×4): qty 1

## 2024-03-26 MED ORDER — 0.9 % SODIUM CHLORIDE (POUR BTL) OPTIME
TOPICAL | Status: DC | PRN
  Administered 2024-03-26: 1000 mL

## 2024-03-26 MED ORDER — TRANEXAMIC ACID-NACL 1000-0.7 MG/100ML-% IV SOLN
1000.0000 mg | Freq: Once | INTRAVENOUS | Status: AC
  Administered 2024-03-26: 1000 mg via INTRAVENOUS
  Filled 2024-03-26: qty 100

## 2024-03-26 MED ORDER — OXYCODONE HCL 5 MG/5ML PO SOLN: 5.0000 mg | Freq: Once | ORAL | Status: DC | PRN

## 2024-03-26 MED ORDER — FENTANYL CITRATE (PF) 250 MCG/5ML IJ SOLN
INTRAMUSCULAR | Status: DC | PRN
  Administered 2024-03-26: 100 ug via INTRAVENOUS

## 2024-03-26 MED ORDER — CEFAZOLIN SODIUM-DEXTROSE 2-4 GM/100ML-% IV SOLN
INTRAVENOUS | Status: AC
  Filled 2024-03-26: qty 100

## 2024-03-26 MED ORDER — PHENYLEPHRINE 80 MCG/ML (10ML) SYRINGE FOR IV PUSH (FOR BLOOD PRESSURE SUPPORT)
PREFILLED_SYRINGE | INTRAVENOUS | Status: AC
  Filled 2024-03-26: qty 10

## 2024-03-26 MED ORDER — ONDANSETRON HCL 4 MG/2ML IJ SOLN
INTRAMUSCULAR | Status: AC
  Filled 2024-03-26: qty 2

## 2024-03-26 MED ORDER — POVIDONE-IODINE 10 % EX SWAB
2.0000 | Freq: Once | CUTANEOUS | Status: AC
  Administered 2024-03-26: 2 via TOPICAL

## 2024-03-26 MED ORDER — SUGAMMADEX SODIUM 200 MG/2ML IV SOLN
INTRAVENOUS | Status: DC | PRN
  Administered 2024-03-26: 100 mg via INTRAVENOUS
  Administered 2024-03-26: 200 mg via INTRAVENOUS

## 2024-03-26 MED ORDER — DEXAMETHASONE SODIUM PHOSPHATE 10 MG/ML IJ SOLN
INTRAMUSCULAR | Status: DC | PRN
  Administered 2024-03-26: 10 mg via INTRAVENOUS

## 2024-03-26 MED ORDER — ACETAMINOPHEN 500 MG PO TABS
1000.0000 mg | ORAL_TABLET | Freq: Four times a day (QID) | ORAL | Status: DC
  Administered 2024-03-26 – 2024-03-27 (×4): 1000 mg via ORAL
  Filled 2024-03-26 (×4): qty 2

## 2024-03-26 MED ORDER — ACETAMINOPHEN 500 MG PO TABS
1000.0000 mg | ORAL_TABLET | ORAL | Status: AC
  Administered 2024-03-26: 1000 mg via ORAL

## 2024-03-26 MED ORDER — LIDOCAINE 2% (20 MG/ML) 5 ML SYRINGE
INTRAMUSCULAR | Status: AC
  Filled 2024-03-26: qty 5

## 2024-03-26 MED ORDER — KETAMINE HCL 10 MG/ML IJ SOLN
INTRAMUSCULAR | Status: DC | PRN
  Administered 2024-03-26: 20 mg via INTRAVENOUS
  Administered 2024-03-26: 30 mg via INTRAVENOUS

## 2024-03-26 MED ORDER — ONDANSETRON HCL 4 MG PO TABS: 4.0000 mg | ORAL_TABLET | Freq: Four times a day (QID) | ORAL | Status: DC | PRN

## 2024-03-26 MED ORDER — ONDANSETRON HCL 4 MG/2ML IJ SOLN
INTRAMUSCULAR | Status: DC | PRN
  Administered 2024-03-26: 4 mg via INTRAVENOUS

## 2024-03-26 MED ORDER — BUPIVACAINE HCL (PF) 0.25 % IJ SOLN
INTRAMUSCULAR | Status: AC
  Filled 2024-03-26: qty 30

## 2024-03-26 MED ORDER — DOCUSATE SODIUM 100 MG PO CAPS
100.0000 mg | ORAL_CAPSULE | Freq: Two times a day (BID) | ORAL | Status: DC
  Administered 2024-03-26: 100 mg via ORAL
  Filled 2024-03-26 (×2): qty 1

## 2024-03-26 MED ORDER — PROPOFOL 10 MG/ML IV BOLUS
INTRAVENOUS | Status: DC | PRN
  Administered 2024-03-26: 150 mg via INTRAVENOUS
  Administered 2024-03-26: 25 ug/kg/min via INTRAVENOUS

## 2024-03-26 MED ORDER — ONDANSETRON HCL 4 MG/2ML IJ SOLN: 4.0000 mg | Freq: Four times a day (QID) | INTRAMUSCULAR | Status: DC | PRN

## 2024-03-26 MED ORDER — ROCURONIUM BROMIDE 10 MG/ML (PF) SYRINGE
PREFILLED_SYRINGE | INTRAVENOUS | Status: DC | PRN
  Administered 2024-03-26: 10 mg via INTRAVENOUS
  Administered 2024-03-26: 20 mg via INTRAVENOUS
  Administered 2024-03-26: 50 mg via INTRAVENOUS

## 2024-03-26 MED ORDER — SIMETHICONE 80 MG PO CHEW: 80.0000 mg | CHEWABLE_TABLET | Freq: Four times a day (QID) | ORAL | Status: DC | PRN

## 2024-03-26 MED ORDER — ROCURONIUM BROMIDE 10 MG/ML (PF) SYRINGE
PREFILLED_SYRINGE | INTRAVENOUS | Status: AC
  Filled 2024-03-26: qty 10

## 2024-03-26 MED ORDER — CHLORHEXIDINE GLUCONATE 0.12 % MT SOLN
15.0000 mL | Freq: Once | OROMUCOSAL | Status: AC
  Administered 2024-03-26: 15 mL via OROMUCOSAL

## 2024-03-26 MED ORDER — MIDAZOLAM HCL 2 MG/2ML IJ SOLN
INTRAMUSCULAR | Status: AC
  Filled 2024-03-26: qty 2

## 2024-03-26 MED ORDER — AMISULPRIDE (ANTIEMETIC) 5 MG/2ML IV SOLN: 10.0000 mg | Freq: Once | INTRAVENOUS | Status: DC | PRN

## 2024-03-26 MED ORDER — IBUPROFEN 800 MG PO TABS: 800.0000 mg | ORAL_TABLET | Freq: Three times a day (TID) | ORAL | 1 refills | Status: DC | PRN

## 2024-03-26 MED ORDER — FENTANYL CITRATE (PF) 250 MCG/5ML IJ SOLN
INTRAMUSCULAR | Status: AC
  Filled 2024-03-26: qty 5

## 2024-03-26 MED ORDER — PHENYLEPHRINE 80 MCG/ML (10ML) SYRINGE FOR IV PUSH (FOR BLOOD PRESSURE SUPPORT)
PREFILLED_SYRINGE | INTRAVENOUS | Status: DC | PRN
  Administered 2024-03-26 (×4): 80 ug via INTRAVENOUS

## 2024-03-26 MED ORDER — KETAMINE HCL 50 MG/5ML IJ SOSY
PREFILLED_SYRINGE | INTRAMUSCULAR | Status: AC
  Filled 2024-03-26: qty 5

## 2024-03-26 MED ORDER — DEXAMETHASONE SODIUM PHOSPHATE 10 MG/ML IJ SOLN
INTRAMUSCULAR | Status: AC
  Filled 2024-03-26: qty 1

## 2024-03-26 MED ORDER — IBUPROFEN 600 MG PO TABS: 600.0000 mg | ORAL_TABLET | Freq: Four times a day (QID) | ORAL | Status: DC

## 2024-03-26 MED ORDER — ACETAMINOPHEN 500 MG PO TABS
ORAL_TABLET | ORAL | Status: AC
  Filled 2024-03-26: qty 2

## 2024-03-26 MED ORDER — OXYCODONE HCL 5 MG PO TABS: 5.0000 mg | ORAL_TABLET | Freq: Once | ORAL | Status: DC | PRN

## 2024-03-26 MED ORDER — LIDOCAINE 2% (20 MG/ML) 5 ML SYRINGE
INTRAMUSCULAR | Status: DC | PRN
Start: 2024-03-26 — End: 2024-03-26
  Administered 2024-03-26: 100 mg via INTRAVENOUS

## 2024-03-26 MED ORDER — FENTANYL CITRATE (PF) 100 MCG/2ML IJ SOLN
INTRAMUSCULAR | Status: AC
  Filled 2024-03-26: qty 2

## 2024-03-26 MED ORDER — BUPIVACAINE HCL (PF) 0.25 % IJ SOLN
INTRAMUSCULAR | Status: DC | PRN
  Administered 2024-03-26: 8 mL

## 2024-03-26 MED ORDER — FENTANYL CITRATE (PF) 100 MCG/2ML IJ SOLN
25.0000 ug | INTRAMUSCULAR | Status: DC | PRN
  Administered 2024-03-26 (×2): 25 ug via INTRAVENOUS

## 2024-03-26 MED ORDER — CEFAZOLIN SODIUM-DEXTROSE 2-4 GM/100ML-% IV SOLN
2.0000 g | INTRAVENOUS | Status: AC
  Administered 2024-03-26: 2 g via INTRAVENOUS

## 2024-03-26 MED ORDER — OXYCODONE HCL 5 MG PO TABS: 5.0000 mg | ORAL_TABLET | ORAL | Status: DC | PRN

## 2024-03-26 MED ORDER — MIDAZOLAM HCL 2 MG/2ML IJ SOLN
INTRAMUSCULAR | Status: DC | PRN
  Administered 2024-03-26: 2 mg via INTRAVENOUS

## 2024-03-26 MED ORDER — CHLORHEXIDINE GLUCONATE 0.12 % MT SOLN
OROMUCOSAL | Status: AC
  Filled 2024-03-26: qty 15

## 2024-03-26 SURGICAL SUPPLY — 33 items
BARRIER ADHS 3X4 INTERCEED (GAUZE/BANDAGES/DRESSINGS) IMPLANT
BLADE SURG 10 STRL SS (BLADE) IMPLANT
COVER MAYO STAND STRL (DRAPES) ×1 IMPLANT
DERMABOND ADVANCED .7 DNX12 (GAUZE/BANDAGES/DRESSINGS) ×1 IMPLANT
DEVICE RETRIEVAL ALEXIS 14 (MISCELLANEOUS) IMPLANT
DRAPE SURG IRRIG POUCH 19X23 (DRAPES) ×1 IMPLANT
DRSG OPSITE POSTOP 3X4 (GAUZE/BANDAGES/DRESSINGS) IMPLANT
DURAPREP 26ML APPLICATOR (WOUND CARE) ×1 IMPLANT
GLOVE BIO SURGEON STRL SZ 6 (GLOVE) ×1 IMPLANT
GLOVE BIOGEL PI IND STRL 6.5 (GLOVE) ×2 IMPLANT
GLOVE BIOGEL PI IND STRL 7.0 (GLOVE) ×2 IMPLANT
GLOVE SS BIOGEL STRL SZ 7 (GLOVE) IMPLANT
GOWN STRL REUS W/ TWL LRG LVL3 (GOWN DISPOSABLE) ×1 IMPLANT
GOWN STRL SURGICAL XL XLNG (GOWN DISPOSABLE) IMPLANT
IRRIGATION SUCT STRKRFLW 2 WTP (MISCELLANEOUS) IMPLANT
IV NS 1000ML BAXH (IV SOLUTION) IMPLANT
KIT PINK PAD W/HEAD ARM REST (MISCELLANEOUS) ×1 IMPLANT
KIT TURNOVER KIT B (KITS) ×1 IMPLANT
NS IRRIG 1000ML POUR BTL (IV SOLUTION) ×1 IMPLANT
PACK LAPAROSCOPY BASIN (CUSTOM PROCEDURE TRAY) ×1 IMPLANT
SCISSORS LAP 5X35 DISP (ENDOMECHANICALS) IMPLANT
SET CYSTO W/LG BORE CLAMP LF (SET/KITS/TRAYS/PACK) ×1 IMPLANT
SET TUBE SMOKE EVAC HIGH FLOW (TUBING) ×1 IMPLANT
SHEARS HARMONIC 36 ACE (MISCELLANEOUS) IMPLANT
SLEEVE Z-THREAD 5X100MM (TROCAR) ×1 IMPLANT
SUT VIC AB 3-0 PS2 18XBRD (SUTURE) ×1 IMPLANT
SUT VICRYL 0 UR6 27IN ABS (SUTURE) ×1 IMPLANT
SYSTEM BAG RETRIEVAL 10MM (BASKET) IMPLANT
TOWEL GREEN STERILE FF (TOWEL DISPOSABLE) ×1 IMPLANT
TRAY FOLEY W/BAG SLVR 14FR (SET/KITS/TRAYS/PACK) ×1 IMPLANT
TROCAR BALLN 12MMX100 BLUNT (TROCAR) IMPLANT
TROCAR Z-THREAD OPTICAL 5X100M (TROCAR) ×1 IMPLANT
WARMER LAPAROSCOPE (MISCELLANEOUS) ×1 IMPLANT

## 2024-03-26 NOTE — Progress Notes (Signed)
 POD #0  Subjective:  Initial trouble voiding requiring straight cath upon arrival to [facility] however since then has voided on her ow. Pt denies problems with ambulating, voiding or po intake.  She denies nausea or vomiting.  Pain is well controlled.  She has not had flatus. She has not had bowel movement. Scant spotting while urinating just now. Intra-op procedure reviewed, uncomplicated. Reviewed possibility of spotting given LSH but no frank menses  Objective: Blood pressure (!) 102/42, pulse 91, temperature 98.5 F (36.9 C), temperature source Oral, resp. rate 16, height 5' 8 (1.727 m), weight 68 kg, last menstrual period 03/19/2024, SpO2 98%, unknown if currently breastfeeding.  Physical Exam:  General: alert, cooperative and no distress Lochia:normal flow Chest: CTAB Heart: RRR no m/r/g Abdomen: +BS, soft, nontender. Infraumbilical honeycomb CDI, BL LQ incisions CDI Extremities: neg edema, neg calf TTP BL, neg Homans BL  Recent Labs    03/24/24 0918  HGB 12.5  HCT 39.2    Assessment/Plan:  ASSESSMENT: Chelsey Hess is a 48 y.o. [id number] s/p LSH for AUB/HMB/dysmenorrhea. PMHx s/f protein S deficiency, chronic anemia.   -Continue PO pain meds -Encourage ambulation and incentive spirometry -DC IVF -Anticipate DC home PDO#1   LOS: 0 days

## 2024-03-26 NOTE — Transfer of Care (Signed)
 Immediate Anesthesia Transfer of Care Note  Patient: Chelsey Hess  Procedure(s) Performed: LAPAROSCOPIC SUPRACERVICAL HYSTERECTOMY AND BILATERAL SALPINGECTOMY CYSTOSCOPY  Patient Location: PACU  Anesthesia Type:General  Level of Consciousness: drowsy and patient cooperative  Airway & Oxygen Therapy: Patient connected to face mask oxygen  Post-op Assessment: Report given to RN and Post -op Vital signs reviewed and stable  Post vital signs: Reviewed and stable  Last Vitals:  Vitals Value Taken Time  BP 110/59 03/26/24 09:54  Temp 97.9   Pulse 78 03/26/24 09:56  Resp 16 03/26/24 09:56  SpO2 100 % 03/26/24 09:56  Vitals shown include unfiled device data.  Last Pain:  Vitals:   03/26/24 0603  TempSrc: Oral  PainSc: 0-No pain      Patients Stated Pain Goal: 5 (03/26/24 0603)  Complications: There were no known notable events for this encounter.

## 2024-03-26 NOTE — Anesthesia Procedure Notes (Signed)
 Procedure Name: Intubation Date/Time: 03/26/2024 7:40 AM  Performed by: Christopher Comings, CRNAPre-anesthesia Checklist: Patient identified, Emergency Drugs available, Suction available and Patient being monitored Patient Re-evaluated:Patient Re-evaluated prior to induction Oxygen Delivery Method: Circle system utilized Preoxygenation: Pre-oxygenation with 100% oxygen Induction Type: IV induction Ventilation: Mask ventilation without difficulty Laryngoscope Size: Mac and 4 Grade View: Grade I Tube type: Oral Tube size: 7.0 mm Number of attempts: 1 Airway Equipment and Method: Stylet and Oral airway Placement Confirmation: ETT inserted through vocal cords under direct vision, positive ETCO2 and breath sounds checked- equal and bilateral Secured at: 23 cm Tube secured with: Tape Dental Injury: Teeth and Oropharynx as per pre-operative assessment

## 2024-03-26 NOTE — Interval H&P Note (Signed)
 History and Physical Interval Note:  03/26/2024 7:17 AM  Chelsey Hess  has presented today for surgery, with the diagnosis of abnormal uterine bleeding.  The various methods of treatment have been discussed with the patient and family. After consideration of risks, benefits and other options for treatment, the patient has consented to  Procedure(s) with comments: LAPAROSCOPIC SUPRACERVICAL HYSTERECTOMY AND BILATERAL SALPINGECTOMY (N/A) - with Bilateral Salpingectomy CYSTOSCOPY (N/A) as a surgical intervention.  The patient's history has been reviewed, patient examined, no change in status, stable for surgery.  I have reviewed the patient's chart and labs.  Questions were answered to the patient's satisfaction.     Ayodele Hartsock M Kullen Tomasetti

## 2024-03-26 NOTE — Op Note (Signed)
 Preoperative diagnosis: Menorrhagia, dysmenorrhea Postoperative diagnosis: Same Procedure: Laparoscopic supracervical hysterectomy, bilateral salpingectomy, cystoscopy Surgeon: Lavonia Guppy, MD Assistant: Krystal Deaner, MD Anesthesia: GETA by Dr Dasie Findings: External genitalia WNL. Bimanual exam revealed anteverted uterus approx 11cm, globular fundus. Bilateral adnexa grossly WNL.Upon insertion of laparoscope, uterine fundus filling pelvis. Left fallopian tube adherent to left pelvic wall, filmy adhesions. Bilateral ovaries WNL. Right fallopian tube WNL.  Specimens: Morcellated uterus and bilateral fallopian tubes for routine pathology.  IVF: 1600cc UOP: 200cc Estimated blood loss: 100 cc Complications: None   Procedure in detail: The patient was taken to the operating room and placed in the dorsosupine position. General anesthesia was induced. Ancef  2 g IV given at the beginning of the procedure per ACOG and had PAS hose on throughout the procedure. Arms were tucked to her sides and legs were placed in mobile stirrups. Abdomen perineum and vagina were then prepped and draped in usual sterile fashion and a Foley catheter was inserted. Infraumbilical skin was infiltrated with quarter percent Marcaine  and a 3 cm horizontal incision was made.  The fascia was elevated and entered sharply with scissors.  Peritoneum was then entered bluntly.  A pursestring suture of 0 Vicryl was then placed around the fascial incision.  The Hassan cannula with a balloon was then inserted and the abdomen was insufflated.  The laparoscope was inserted and confirmed good placement.  A 5 mm port was then placed on each side under direct visualization. Inspection revealed the above-mentioned findings.  The distal left fallopian tube was grasped and elevated from pelvic sidewall. Harmonic scalpel was used to remove the tube from the ovary and take down the mesosalpinx to the uterus.  The left uterine cornu was grasped with a  single-tooth tenaculum from the left side. The Harmonic scalpel Ace was used to take down the left round ligament, utero-ovarian pedicle and broad ligament. The anterior peritoneum was incised across the anterior portion of the uterus to help release the bladder. Uterine artery artery was skeletonized and taken down with the harmonic scalpel Ace with adequate division and adequate hemostasis. A similar procedure was then performed on the patient's right side taking down the mesosalpinx to free the tube, round ligament, utero-ovarian pedicle, and broad ligament. Anterior peritoneum was incised across the anterior portion the uterus to meet the incision coming from the patient's right side. Uterine artery was skeletonized and taken down with the Harmonic Scalpel with adequate division and adequate hemostasis. I then began to remove the uterus from the cervix using a drill, clamp, cut technique on maximum power, using the Harmonic scalpel Ace. This was done about halfway on the left side and then halfway on the right side removing the uterus from the cervix. The cervical stump appeared to be hemostatic.  The umbilical trocar was removed and an Alexis bag was inserted through the incision into the pelvis.  The umbilical trocar was reintroduced and the abdomen was reinsufflated.  I was then able to position the bag in the pelvis and placed the uterus in the bag.  The string for the bag was grasped through the umbilical trocar and pulled up through that trocar as it was again removed.  The bag was then brought through the incision so that we could morcellate the uterus.  I then placed the guard in the bag to protect the abdominal incision. Subsequently, morcellation carried out using scalpel in order to remove the uterus in several pieces in the bag.  The bag was removed and the  umbilical trocar was replaced.  Pelvis was copiously irrigated. Small amount of bleeding from the cervical stump was controlled with the harmonic  scalpel Ace. A piece of Interceed was placed over the cervical stump. At this point all pedicles appeared to be hemostatic and there was no other pathology noted.  The 5 mm ports were removed under direct visualization all gas was allowed to deflate from the abdomen.  The umbilical trocar was removed.  The previously placed pursestring suture was tied and this achieved good fascial closure.  Skin incisions were closed with interrupted subcuticular sutures of 4-0 Vicryl followed by Dermabond.   At this time attention turned towards cystoscopy.  Foley catheter removed, routine cystoscopy was carried out. Bilateral efflux noted from ureters.  No suture evident within the bladder.  Bubble sign positive. Foley catheter was not replaced.  The patient tolerated the procedure well. She was taken to the recovery in stable condition. Counts were correct x2.

## 2024-03-27 ENCOUNTER — Encounter (HOSPITAL_COMMUNITY): Payer: Self-pay | Admitting: Obstetrics and Gynecology

## 2024-03-27 DIAGNOSIS — N920 Excessive and frequent menstruation with regular cycle: Secondary | ICD-10-CM | POA: Diagnosis not present

## 2024-03-27 LAB — CBC WITH DIFFERENTIAL/PLATELET
Abs Immature Granulocytes: 0.05 K/uL (ref 0.00–0.07)
Basophils Absolute: 0 K/uL (ref 0.0–0.1)
Basophils Relative: 0 %
Eosinophils Absolute: 0 K/uL (ref 0.0–0.5)
Eosinophils Relative: 0 %
HCT: 29.9 % — ABNORMAL LOW (ref 36.0–46.0)
Hemoglobin: 9.6 g/dL — ABNORMAL LOW (ref 12.0–15.0)
Immature Granulocytes: 0 %
Lymphocytes Relative: 16 %
Lymphs Abs: 1.9 K/uL (ref 0.7–4.0)
MCH: 24.6 pg — ABNORMAL LOW (ref 26.0–34.0)
MCHC: 32.1 g/dL (ref 30.0–36.0)
MCV: 76.5 fL — ABNORMAL LOW (ref 80.0–100.0)
Monocytes Absolute: 1 K/uL (ref 0.1–1.0)
Monocytes Relative: 9 %
Neutro Abs: 8.6 K/uL — ABNORMAL HIGH (ref 1.7–7.7)
Neutrophils Relative %: 75 %
Platelets: 174 K/uL (ref 150–400)
RBC: 3.91 MIL/uL (ref 3.87–5.11)
RDW: 22 % — ABNORMAL HIGH (ref 11.5–15.5)
WBC: 11.6 K/uL — ABNORMAL HIGH (ref 4.0–10.5)
nRBC: 0 % (ref 0.0–0.2)

## 2024-03-27 LAB — SURGICAL PATHOLOGY

## 2024-03-27 NOTE — Progress Notes (Signed)
Discharge instructions and prescriptions given to pt. Discussed post-op care, signs and symptoms to report to the MD, upcoming appointments, and meds. Pt verbalizes understanding and has no questions or concerns at this time. Pt discharged home from hospital in stable condition.

## 2024-03-27 NOTE — Discharge Summary (Signed)
 Physician Discharge Summary  Patient ID: Chelsey Hess MRN: [id number] DOB/AGE: 46-Mar-1977 46 y.o.  Admit date: 03/26/2024 Discharge date: 03/27/2024  Admission Diagnoses:  Discharge Diagnoses:  Principal Problem:   Abnormal uterine bleeding (AUB)   Discharged Condition: good  Hospital Course: Admitted for scheduled LSH for AUB/HMB/dysmenorrhea. Uncomplicated procedure, please see operative note for full details. By POD#1, ambulating, tolerating PO, voiding, pain controlled on PO meds. Discharged home in stable fashion with routine precautions   Treatments: surgery: LSH/BS/cysto  Discharge Exam: Blood pressure (!) 103/55, pulse 82, temperature 97.7 F (36.5 C), temperature source Oral, resp. rate 18, height 5' 8 (1.727 m), weight 68 kg, last menstrual period 03/19/2024, SpO2 98%, unknown if currently breastfeeding. General: alert, cooperative and no distress Lochia:normal flow Chest: CTAB Heart: RRR no m/r/g Abdomen: +BS, soft, nontender. Infraumbilical honeycomb CDI, BL LQ incisions CDI Extremities: neg edema, neg calf TTP BL, neg Homans BL  Disposition: Discharge disposition: 01-Home or Self Care       Discharge Instructions     Call MD for:  difficulty breathing, headache or visual disturbances   Complete by: As directed    Call MD for:  hives   Complete by: As directed    Call MD for:  persistant dizziness or light-headedness   Complete by: As directed    Call MD for:  persistant nausea and vomiting   Complete by: As directed    Call MD for:  redness, tenderness, or signs of infection (pain, swelling, redness, odor or green/yellow discharge around incision site)   Complete by: As directed    Call MD for:  severe uncontrolled pain   Complete by: As directed    Call MD for:  temperature >100.4   Complete by: As directed    Diet - low sodium heart healthy   Complete by: As directed    Increase activity slowly   Complete by: As directed    Lifting  restrictions   Complete by: As directed    15lbs   Sexual Activity Restrictions   Complete by: As directed    None for 4 weeks      Allergies as of 03/27/2024       Reactions   Metronidazole Other (See Comments)   Dizziness, headache, bone pain        Medication List     STOP taking these medications    ferrous sulfate  325 (65 FE) MG tablet   multivitamin-prenatal 27-0.8 MG Tabs tablet       TAKE these medications    ibuprofen  800 MG tablet Commonly known as: ADVIL  Take 1 tablet (800 mg total) by mouth every 8 (eight) hours as needed.   oxyCODONE  5 MG immediate release tablet Commonly known as: Oxy IR/ROXICODONE  Take 1 tablet (5 mg total) by mouth every 6 (six) hours as needed for severe pain (pain score 7-10).         Signed: [name] 03/27/2024, 8:33 AM

## 2024-03-27 NOTE — Anesthesia Postprocedure Evaluation (Signed)
 Anesthesia Post Note  Patient: Chelsey Hess  Procedure(s) Performed: LAPAROSCOPIC SUPRACERVICAL HYSTERECTOMY AND BILATERAL SALPINGECTOMY CYSTOSCOPY     Patient location during evaluation: PACU Anesthesia Type: General Level of consciousness: awake and alert Pain management: pain level controlled Vital Signs Assessment: post-procedure vital signs reviewed and stable Respiratory status: spontaneous breathing, nonlabored ventilation, respiratory function stable and patient connected to nasal cannula oxygen Cardiovascular status: blood pressure returned to baseline and stable Postop Assessment: no apparent nausea or vomiting Anesthetic complications: no   There were no known notable events for this encounter.  Last Vitals:  Vitals:   03/27/24 0428 03/27/24 0724  BP: (!) 107/54 (!) 103/55  Pulse: 75 82  Resp:  18  Temp:  36.5 C  SpO2:  98%    Last Pain:  Vitals:   03/27/24 0724  TempSrc: Oral  PainSc:                  Chelsey Hess L Tej Murdaugh

## 2024-03-27 NOTE — Progress Notes (Signed)
 POD #1  Subjective:  No acute events overnight.  Pt denies problems with ambulating, voiding or po intake.  She denies nausea or vomiting.  Pain is well controlled.  She has had flatus. She has not had bowel movement. .   Objective: Blood pressure (!) 103/55, pulse 82, temperature 97.7 F (36.5 C), temperature source Oral, resp. rate 18, height 5' 8 (1.727 m), weight 68 kg, last menstrual period 03/19/2024, SpO2 98%, unknown if currently breastfeeding.  Physical Exam:  General: alert, cooperative and no distress Lochia:normal flow Chest: CTAB Heart: RRR no m/r/g Abdomen: +BS, soft, nontender. Infraumbilical honeycomb CDI, BL LQ incisions CDI Extremities: neg edema, neg calf TTP BL, neg Homans BL  Recent Labs    03/24/24 0918 03/27/24 0538  HGB 12.5 9.6*  HCT 39.2 29.9*    Assessment/Plan:  ASSESSMENT: Chelsey Hess is a 48 y.o. [id number] s/p LSH for AUB/HMB/dysmenorrhea. PMHx s/f protein S deficiency, chronic anemia.    -Continue PO pain meds -Encourage ambulation and incentive spirometry -DC IVF -Anticipate DC home today   LOS: 0 days

## 2024-03-30 ENCOUNTER — Other Ambulatory Visit: Payer: Self-pay | Admitting: *Deleted

## 2024-03-30 ENCOUNTER — Telehealth: Payer: Self-pay | Admitting: *Deleted

## 2024-03-30 DIAGNOSIS — D500 Iron deficiency anemia secondary to blood loss (chronic): Secondary | ICD-10-CM

## 2024-03-30 DIAGNOSIS — D509 Iron deficiency anemia, unspecified: Secondary | ICD-10-CM

## 2024-03-30 NOTE — Telephone Encounter (Signed)
 Message received from patient stating that she had her hystercectomy on 03/26/24 and feels as though her iron  level is low d/t increased dizziness and hot flashes during the night.  Lauraine Pepper NP notified and order received for pt to come in for lab check to check iron  levels.  Message sent to scheduling.

## 2024-03-31 ENCOUNTER — Inpatient Hospital Stay: Attending: Hematology & Oncology

## 2024-03-31 DIAGNOSIS — N920 Excessive and frequent menstruation with regular cycle: Secondary | ICD-10-CM | POA: Diagnosis present

## 2024-03-31 DIAGNOSIS — D500 Iron deficiency anemia secondary to blood loss (chronic): Secondary | ICD-10-CM | POA: Diagnosis present

## 2024-03-31 DIAGNOSIS — D509 Iron deficiency anemia, unspecified: Secondary | ICD-10-CM

## 2024-03-31 LAB — IRON AND IRON BINDING CAPACITY (CC-WL,HP ONLY)
Iron: 61 ug/dL (ref 28–170)
Saturation Ratios: 15 % (ref 10.4–31.8)
TIBC: 406 ug/dL (ref 250–450)
UIBC: 345 ug/dL

## 2024-03-31 LAB — FERRITIN: Ferritin: 32 ng/mL (ref 11–307)

## 2024-04-03 ENCOUNTER — Inpatient Hospital Stay

## 2024-04-03 VITALS — BP 122/46 | HR 74 | Temp 97.9°F | Resp 18

## 2024-04-03 DIAGNOSIS — D500 Iron deficiency anemia secondary to blood loss (chronic): Secondary | ICD-10-CM

## 2024-04-03 MED ORDER — SODIUM CHLORIDE 0.9 % IV SOLN: Freq: Once | INTRAVENOUS | Status: AC

## 2024-04-03 MED ORDER — SODIUM CHLORIDE 0.9% FLUSH: 10.0000 mL | Freq: Once | INTRAVENOUS | Status: DC | PRN

## 2024-04-03 MED ORDER — SODIUM CHLORIDE 0.9% FLUSH: 3.0000 mL | Freq: Once | INTRAVENOUS | Status: DC | PRN

## 2024-04-03 MED ORDER — IRON SUCROSE 300 MG IVPB - SIMPLE MED
300.0000 mg | Freq: Once | Status: AC
  Administered 2024-04-03: 300 mg via INTRAVENOUS
  Filled 2024-04-03: qty 300

## 2024-04-03 NOTE — Progress Notes (Signed)
 Instructed patient at discharge to go to an Urgent Care or Emergency Room to have her right lower calf evaluated. She complained of pain for the last two to three days after her surgery. Pain scale is a 7/10. She verbalized understanding.

## 2024-04-03 NOTE — Patient Instructions (Signed)

## 2024-04-09 ENCOUNTER — Inpatient Hospital Stay

## 2024-04-14 ENCOUNTER — Ambulatory Visit (HOSPITAL_BASED_OUTPATIENT_CLINIC_OR_DEPARTMENT_OTHER)
Admission: RE | Admit: 2024-04-14 | Discharge: 2024-04-14 | Disposition: A | Discharge status: Home / Self Care | Source: Ambulatory Visit | Attending: Physician Assistant | Admitting: Physician Assistant

## 2024-04-14 ENCOUNTER — Other Ambulatory Visit (HOSPITAL_BASED_OUTPATIENT_CLINIC_OR_DEPARTMENT_OTHER): Payer: Self-pay | Admitting: Physician Assistant

## 2024-04-14 DIAGNOSIS — M7989 Other specified soft tissue disorders: Secondary | ICD-10-CM

## 2024-04-15 ENCOUNTER — Inpatient Hospital Stay: Attending: Hematology & Oncology

## 2024-04-15 VITALS — BP 109/53 | HR 83 | Temp 97.7°F | Resp 18

## 2024-04-15 DIAGNOSIS — N920 Excessive and frequent menstruation with regular cycle: Secondary | ICD-10-CM | POA: Insufficient documentation

## 2024-04-15 DIAGNOSIS — D500 Iron deficiency anemia secondary to blood loss (chronic): Secondary | ICD-10-CM | POA: Insufficient documentation

## 2024-04-15 MED ORDER — SODIUM CHLORIDE 0.9 % IV SOLN: Freq: Once | INTRAVENOUS | Status: AC

## 2024-04-15 MED ORDER — IRON SUCROSE 300 MG IVPB - SIMPLE MED
300.0000 mg | Freq: Once | Status: AC
  Administered 2024-04-15: 300 mg via INTRAVENOUS
  Filled 2024-04-15: qty 300

## 2024-04-15 NOTE — Patient Instructions (Signed)

## 2024-04-27 ENCOUNTER — Inpatient Hospital Stay

## 2024-06-01 ENCOUNTER — Inpatient Hospital Stay (HOSPITAL_BASED_OUTPATIENT_CLINIC_OR_DEPARTMENT_OTHER): Admitting: Family

## 2024-06-01 ENCOUNTER — Encounter: Payer: Self-pay | Admitting: Family

## 2024-06-01 ENCOUNTER — Inpatient Hospital Stay: Attending: Hematology & Oncology

## 2024-06-01 VITALS — BP 116/52 | HR 74 | Temp 98.3°F | Resp 17 | Wt 153.8 lb

## 2024-06-01 DIAGNOSIS — Z86718 Personal history of other venous thrombosis and embolism: Secondary | ICD-10-CM | POA: Diagnosis not present

## 2024-06-01 DIAGNOSIS — E611 Iron deficiency: Secondary | ICD-10-CM | POA: Insufficient documentation

## 2024-06-01 DIAGNOSIS — D509 Iron deficiency anemia, unspecified: Secondary | ICD-10-CM | POA: Diagnosis not present

## 2024-06-01 DIAGNOSIS — D500 Iron deficiency anemia secondary to blood loss (chronic): Secondary | ICD-10-CM

## 2024-06-01 LAB — IRON AND IRON BINDING CAPACITY (CC-WL,HP ONLY)
Iron: 110 ug/dL (ref 28–170)
Saturation Ratios: 28 % (ref 10.4–31.8)
TIBC: 391 ug/dL (ref 250–450)
UIBC: 281 ug/dL

## 2024-06-01 LAB — CBC WITH DIFFERENTIAL (CANCER CENTER ONLY)
Abs Immature Granulocytes: 0.03 K/uL (ref 0.00–0.07)
Basophils Absolute: 0.1 K/uL (ref 0.0–0.1)
Basophils Relative: 1 %
Eosinophils Absolute: 0.2 K/uL (ref 0.0–0.5)
Eosinophils Relative: 4 %
HCT: 42.8 % (ref 36.0–46.0)
Hemoglobin: 14.3 g/dL (ref 12.0–15.0)
Immature Granulocytes: 1 %
Lymphocytes Relative: 33 %
Lymphs Abs: 1.7 K/uL (ref 0.7–4.0)
MCH: 26.6 pg (ref 26.0–34.0)
MCHC: 33.4 g/dL (ref 30.0–36.0)
MCV: 79.6 fL — ABNORMAL LOW (ref 80.0–100.0)
Monocytes Absolute: 0.4 K/uL (ref 0.1–1.0)
Monocytes Relative: 8 %
Neutro Abs: 2.9 K/uL (ref 1.7–7.7)
Neutrophils Relative %: 53 %
Platelet Count: 211 K/uL (ref 150–400)
RBC: 5.38 MIL/uL — ABNORMAL HIGH (ref 3.87–5.11)
RDW: 12.8 % (ref 11.5–15.5)
WBC Count: 5.3 K/uL (ref 4.0–10.5)
nRBC: 0 % (ref 0.0–0.2)

## 2024-06-01 LAB — RETICULOCYTES
Immature Retic Fract: 3.3 % (ref 2.3–15.9)
RBC.: 5.4 MIL/uL — ABNORMAL HIGH (ref 3.87–5.11)
Retic Count, Absolute: 48.6 K/uL (ref 19.0–186.0)
Retic Ct Pct: 0.9 % (ref 0.4–3.1)

## 2024-06-01 LAB — FERRITIN: Ferritin: 120 ng/mL (ref 11–307)

## 2024-06-01 MED ORDER — IRON 325 (65 FE) MG PO TABS: 1.0000 | ORAL_TABLET | Freq: Every day | ORAL | 3 refills | Status: DC

## 2024-06-01 NOTE — Progress Notes (Signed)
 Hematology and Oncology Follow Up Visit  Chelsey Hess [id number] July 20, 1975 48 y.o. 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 She is doing well but still notes hair loss. All other symptoms have resolved since she received IV iron .  Hgb is improved at 14.3, MCV 79. Iron  studies are pending.  She has not had her TSH/T4 checked recently or seen dermatology. She will see what her iron  looks like and then follow-up with PCP if needed.  No obvious blood loss noted. No bruising or petechiae.  No fever, chills, n/v, cough, rash, dizziness, SOB, chest pain, palpitations, abdominal pain or changes in bowel or bladder habits.   No swelling, numbness or tingling in her extremities at this time.  No falls or syncope.  Appetite and hydration are good. Weight is stable at 153 lbs.   ECOG Performance Status: 1 - Symptomatic but completely ambulatory  Medications:  Allergies as of 06/01/2024       Reactions   Metronidazole Other (See Comments)   Dizziness, headache, bone pain        Medication List        Accurate as of June 01, 2024  2:22 PM. If you have any questions, ask your nurse or doctor.          STOP taking these medications    ibuprofen  800 MG tablet Commonly known as: ADVIL  Stopped by: Lauraine Pepper   oxyCODONE  5 MG immediate release tablet Commonly known as: Oxy IR/ROXICODONE  Stopped by: Lauraine Pepper       TAKE these medications    Iron  325 (65 Fe) MG Tabs Take 1 tablet (325 mg total) by mouth daily at 6 (six) AM. Unsure dose What changed:  medication strength how much to take Changed by: Lauraine Pepper        Allergies:   Allergies  Allergen Reactions   Metronidazole Other (See Comments)    Dizziness, headache, bone pain    Past Medical History, Surgical history, Social history, and Family History were reviewed and updated.  Review of Systems: All other 10 point review of systems is negative.   Physical Exam:  weight is 153 lb 12.8 oz (69.8 kg). Her oral temperature is 98.3 F (36.8 C). Her blood pressure is 116/52 (abnormal) and her pulse is 74. Her respiration is 17 and oxygen saturation is 100%.   Wt Readings from Last 3 Encounters:  06/01/24 153 lb 12.8 oz (69.8 kg)  03/26/24 150 lb (68 kg)  02/24/24 150 lb 6.4 oz (68.2 kg)    Ocular: Sclerae unicteric, pupils equal, round and reactive to light Ear-nose-throat: Oropharynx clear, dentition fair Lymphatic: No cervical or supraclavicular adenopathy Lungs no rales or rhonchi, good excursion bilaterally Heart regular rate and rhythm, no murmur appreciated Abd soft, nontender, positive bowel sounds MSK no focal spinal tenderness, no joint edema Neuro: non-focal, well-oriented, appropriate affect Breasts: Deferred   Lab Results  Component Value Date   WBC 5.3 06/01/2024   HGB 14.3 06/01/2024   HCT 42.8 06/01/2024   MCV 79.6 (L) 06/01/2024   PLT 211 06/01/2024   Lab Results  Component Value Date  FERRITIN 32 03/31/2024   IRON  61 03/31/2024   TIBC 406 03/31/2024   UIBC 345 03/31/2024   IRONPCTSAT 15 03/31/2024   Lab Results  Component Value Date   RETICCTPCT 0.9 06/01/2024   RBC 5.40 (H) 06/01/2024   No results found for: KPAFRELGTCHN, LAMBDASER, KAPLAMBRATIO No results found for: IGGSERUM, IGA, IGMSERUM No results found for: STEPHANY CARLOTA BENSON MARKEL EARLA JOANNIE DOC VICK, SPEI   Chemistry      Component Value Date/Time   NA 143 01/13/2024 1050   K 4.4 01/13/2024 1050   CL 108 01/13/2024 1050   CO2 27 01/13/2024 1050   BUN 20 01/13/2024 1050   CREATININE 0.65 01/13/2024 1050       Component Value Date/Time   CALCIUM 9.4 01/13/2024 1050   ALKPHOS 48 01/13/2024 1050   AST 10 (L) 01/13/2024 1050   ALT 9 01/13/2024 1050   BILITOT 0.4 01/13/2024 1050       Impression and Plan: Chelsey Hess is a very pleasant 48 yo [place] female with history of DVT in the left brachial veins (resolved on 12/18/2019 US ) and 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 No new miscarriage or thrombotic event reported.  She is not currently on an anticoagulant or aspirin.  Iron  studies are pending. We will replace if needed.  She is still taking oral iron  once a day as well.  Follow-up in 3 months.  Lauraine Pepper, NP 11/17/20252:22 PM

## 2024-06-04 ENCOUNTER — Telehealth: Payer: Self-pay

## 2024-06-04 DIAGNOSIS — D509 Iron deficiency anemia, unspecified: Secondary | ICD-10-CM

## 2024-06-04 MED ORDER — IRON 325 (65 FE) MG PO TABS: 1.0000 | ORAL_TABLET | Freq: Every day | ORAL | 3 refills | Status: AC

## 2024-06-04 NOTE — Telephone Encounter (Signed)
 Rx sent in with additional sig unsure dose. Pharmacy requesting verification. Updated rx and resent.

## 2024-09-28 ENCOUNTER — Inpatient Hospital Stay: Attending: Hematology & Oncology

## 2024-09-28 ENCOUNTER — Inpatient Hospital Stay: Admitting: Family
